# Patient Record
Sex: Female | Born: 1965 | Race: Black or African American | Hispanic: No | Marital: Married | Smoking: Never smoker
Health system: Southern US, Community
[De-identification: ages and names within clinical notes are randomized; demographics above are authoritative.]

## PROBLEM LIST (undated history)

## (undated) DIAGNOSIS — K219 Gastro-esophageal reflux disease without esophagitis: Secondary | ICD-10-CM

## (undated) DIAGNOSIS — K581 Irritable bowel syndrome with constipation: Secondary | ICD-10-CM

## (undated) DIAGNOSIS — I10 Essential (primary) hypertension: Secondary | ICD-10-CM

## (undated) DIAGNOSIS — IMO0002 Reserved for concepts with insufficient information to code with codable children: Secondary | ICD-10-CM

## (undated) DIAGNOSIS — K811 Chronic cholecystitis: Principal | ICD-10-CM

## (undated) DIAGNOSIS — M1990 Unspecified osteoarthritis, unspecified site: Secondary | ICD-10-CM

## (undated) DIAGNOSIS — G9050 Complex regional pain syndrome I, unspecified: Secondary | ICD-10-CM

## (undated) DIAGNOSIS — R011 Cardiac murmur, unspecified: Secondary | ICD-10-CM

## (undated) HISTORY — PX: FOOT SURGERY: SHX648

## (undated) HISTORY — DX: Chronic cholecystitis: K81.1

## (undated) HISTORY — DX: Cardiac murmur, unspecified: R01.1

## (undated) HISTORY — PX: ABDOMINAL HYSTERECTOMY: SHX81

## (undated) HISTORY — DX: Gastro-esophageal reflux disease without esophagitis: K21.9

## (undated) HISTORY — DX: Irritable bowel syndrome with constipation: K58.1

## (undated) HISTORY — DX: Reserved for concepts with insufficient information to code with codable children: IMO0002

## (undated) HISTORY — PX: TONSILLECTOMY: SUR1361

---

## 2001-01-12 ENCOUNTER — Ambulatory Visit (HOSPITAL_COMMUNITY): Admission: RE | Admit: 2001-01-12 | Discharge: 2001-01-12 | Payer: Self-pay | Admitting: Gastroenterology

## 2001-01-12 ENCOUNTER — Encounter: Payer: Self-pay | Admitting: Gastroenterology

## 2001-02-07 ENCOUNTER — Encounter: Payer: Self-pay | Admitting: Gastroenterology

## 2001-02-07 ENCOUNTER — Ambulatory Visit (HOSPITAL_COMMUNITY): Admission: RE | Admit: 2001-02-07 | Discharge: 2001-02-07 | Payer: Self-pay | Admitting: Gastroenterology

## 2001-02-23 ENCOUNTER — Encounter: Admission: RE | Admit: 2001-02-23 | Discharge: 2001-02-23 | Payer: Self-pay | Admitting: Gastroenterology

## 2001-02-23 ENCOUNTER — Encounter: Payer: Self-pay | Admitting: Gastroenterology

## 2001-08-24 ENCOUNTER — Emergency Department (HOSPITAL_COMMUNITY): Admission: EM | Admit: 2001-08-24 | Discharge: 2001-08-24 | Payer: Self-pay | Admitting: Emergency Medicine

## 2001-11-21 ENCOUNTER — Ambulatory Visit (HOSPITAL_COMMUNITY): Admission: RE | Admit: 2001-11-21 | Discharge: 2001-11-21 | Payer: Self-pay | Admitting: Gastroenterology

## 2001-11-21 ENCOUNTER — Encounter: Payer: Self-pay | Admitting: Gastroenterology

## 2004-03-09 ENCOUNTER — Other Ambulatory Visit: Admission: RE | Admit: 2004-03-09 | Discharge: 2004-03-09 | Payer: Self-pay | Admitting: Family Medicine

## 2004-06-01 ENCOUNTER — Inpatient Hospital Stay (HOSPITAL_COMMUNITY): Admission: RE | Admit: 2004-06-01 | Discharge: 2004-06-04 | Payer: Self-pay | Admitting: Obstetrics and Gynecology

## 2005-07-27 ENCOUNTER — Other Ambulatory Visit: Admission: RE | Admit: 2005-07-27 | Discharge: 2005-07-27 | Payer: Self-pay | Admitting: Family Medicine

## 2010-11-03 ENCOUNTER — Other Ambulatory Visit (HOSPITAL_COMMUNITY): Payer: Self-pay | Admitting: Gastroenterology

## 2010-11-05 ENCOUNTER — Ambulatory Visit (HOSPITAL_COMMUNITY)
Admission: RE | Admit: 2010-11-05 | Discharge: 2010-11-05 | Disposition: A | Discharge status: Home / Self Care | Payer: Managed Care, Other (non HMO) | Source: Ambulatory Visit | Attending: Gastroenterology | Admitting: Gastroenterology

## 2010-11-05 DIAGNOSIS — R1011 Right upper quadrant pain: Secondary | ICD-10-CM | POA: Insufficient documentation

## 2010-11-18 ENCOUNTER — Encounter (HOSPITAL_COMMUNITY)
Admission: RE | Admit: 2010-11-18 | Discharge: 2010-11-18 | Disposition: A | Discharge status: Home / Self Care | Payer: Managed Care, Other (non HMO) | Source: Ambulatory Visit | Attending: Gastroenterology | Admitting: Gastroenterology

## 2010-11-18 DIAGNOSIS — R112 Nausea with vomiting, unspecified: Secondary | ICD-10-CM | POA: Insufficient documentation

## 2010-11-18 DIAGNOSIS — R109 Unspecified abdominal pain: Secondary | ICD-10-CM | POA: Insufficient documentation

## 2010-11-18 MED ORDER — TECHNETIUM TC 99M MEBROFENIN IV KIT
5.5000 | PACK | Freq: Once | INTRAVENOUS | Status: AC | PRN
  Administered 2010-11-18: 5.5 via INTRAVENOUS

## 2010-12-20 ENCOUNTER — Other Ambulatory Visit: Payer: Self-pay | Admitting: Gastroenterology

## 2010-12-20 DIAGNOSIS — R112 Nausea with vomiting, unspecified: Secondary | ICD-10-CM

## 2010-12-20 DIAGNOSIS — R1011 Right upper quadrant pain: Secondary | ICD-10-CM

## 2010-12-20 DIAGNOSIS — R1013 Epigastric pain: Secondary | ICD-10-CM

## 2010-12-23 ENCOUNTER — Ambulatory Visit
Admission: RE | Admit: 2010-12-23 | Discharge: 2010-12-23 | Disposition: A | Discharge status: Home / Self Care | Payer: Managed Care, Other (non HMO) | Source: Ambulatory Visit | Attending: Gastroenterology | Admitting: Gastroenterology

## 2010-12-23 DIAGNOSIS — R1011 Right upper quadrant pain: Secondary | ICD-10-CM

## 2010-12-23 DIAGNOSIS — R1013 Epigastric pain: Secondary | ICD-10-CM

## 2010-12-23 DIAGNOSIS — R112 Nausea with vomiting, unspecified: Secondary | ICD-10-CM

## 2010-12-23 MED ORDER — IOHEXOL 300 MG/ML  SOLN
125.0000 mL | Freq: Once | INTRAMUSCULAR | Status: AC | PRN
  Administered 2010-12-23: 125 mL via INTRAVENOUS

## 2011-01-03 ENCOUNTER — Encounter (INDEPENDENT_AMBULATORY_CARE_PROVIDER_SITE_OTHER): Payer: Self-pay | Admitting: Surgery

## 2011-01-03 ENCOUNTER — Ambulatory Visit (INDEPENDENT_AMBULATORY_CARE_PROVIDER_SITE_OTHER): Payer: Managed Care, Other (non HMO) | Admitting: Surgery

## 2011-01-03 VITALS — BP 118/86 | HR 92 | Temp 98.8°F | Resp 20 | Ht 64.0 in | Wt 207.2 lb

## 2011-01-03 DIAGNOSIS — E669 Obesity, unspecified: Secondary | ICD-10-CM

## 2011-01-03 DIAGNOSIS — K811 Chronic cholecystitis: Secondary | ICD-10-CM

## 2011-01-03 DIAGNOSIS — R079 Chest pain, unspecified: Secondary | ICD-10-CM

## 2011-01-03 DIAGNOSIS — R0781 Pleurodynia: Secondary | ICD-10-CM | POA: Insufficient documentation

## 2011-01-03 DIAGNOSIS — K219 Gastro-esophageal reflux disease without esophagitis: Secondary | ICD-10-CM

## 2011-01-03 DIAGNOSIS — R0789 Other chest pain: Secondary | ICD-10-CM | POA: Insufficient documentation

## 2011-01-03 HISTORY — DX: Chronic cholecystitis: K81.1

## 2011-01-03 NOTE — Patient Instructions (Signed)
Gallbladder Disease Gallbladder disease (cholecystitis) is an inflammation of your gallbladder. It is usually caused by a build-up of stones (gallstones) or sludge (cholelithiasis) in your gallbladder. The gallbladder is not an essential organ. It is located slightly to the right of center in the belly (abdomen), behind the liver. It stores bile made in the liver. Bile aids in digestion of fats. Gallbladder disease may result in nausea (feeling sick to your stomach), abdominal pain, and jaundice. In severe cases, emergency surgery may be required. The most common type of gallbladder disease is gallstones. They begin as small crystals and slowly grow into stones. Gallstone pain occurs when the bile duct has spasms. The spasms are caused by the stone passing out of the duct. The stone is trying to pass at the same time bile is passing into the small bowel for digestion. The pain usually begins suddenly. It may persist from several minutes to several hours. Infection can occur. Infection can add to discomfort and severity of an acute attack. The pain may be made worse by breathing deeply or by being jarred. There may be fever and tenderness to the touch. In some cases, when gallstones do not move into the bile duct, people have no pain or symptoms. These are called "silent" gallstones. Women are three times more likely to develop gallstones than men. Women who have had several pregnancies are more likely to have gallbladder disease. Physicians sometimes advise removing diseased gallbladders before future pregnancies. Other factors that increase the risk of gallbladder disease are obesity, diets heavy in fried foods and dairy products, increasing age, prolonged use of medications containing female hormones, and heredity. HOME CARE INSTRUCTIONS   If your physician prescribed an antibiotic, take as directed.   Only take over-the-counter or prescription medicines for pain, discomfort, or fever as directed by your  caregiver.   Follow a low fat diet until seen again. (Fat causes the gallbladder to contract.)   Follow-up as instructed. Attacks are almost always recurrent and surgery is usually required for permanent treatment.  SEEK IMMEDIATE MEDICAL CARE IF:   Pain is increasing and not controlled by medications.   The pain moves to another part of your abdomen or to your back. (Right sided pain can be appendicitis and left sided pain in adults can be diverticulitis).   You have a fever.   You develop nausea and vomiting.  Document Released: 01/03/2005 Document Revised: 09/15/2010 Document Reviewed: 08/23/2007 [facility] Patient Information 2012 [place], [place].  Managing Pain  Pain after surgery or related to activity is often due to strain/injury to muscle, tendon, nerves and/or incisions.  This pain is usually short-term and will improve in a few months.   Many people find it helpful to do the following things TOGETHER to help speed the process of healing and to get back to regular activity more quickly:  1. Avoid heavy physical activity a.  no lifting greater than 20 pounds b. Do not "push through" the pain.  Listen to your body and avoid positions and maneuvers than reproduce the pain c. Walking is okay as tolerated, but go slowly and stop when getting sore.  d. Remember: If it hurts to do it, then don't do it! 2. Take Anti-inflammatory medication  a. Take with food/snack around the clock for 1-2 weeks i. This helps the muscle and nerve tissues become less irritable and calm down faster b. Choose ONE of the following over-the-counter medications: i. Acetaminophen 500mg  tabs (Tylenol) 2 pills with every meal and just before  bedtime 3. Use a Heating pad or Ice/Cold Pack a. 6-8 times a day b. May use warm bath/hottub  or showers 4. Try Gentle Massage and/or Stretching  a. at the area of pain many times a day b. stop if you feel pain - do not overdo it  Try these steps together to help  you body heal faster and avoid making things get worse.  Doing just one of these things may not be enough.    If you are not getting better after two weeks or are noticing you are getting worse, contact our office for further advice; we may need to re-evaluate you & see what other things we can do to help. \

## 2011-01-03 NOTE — Progress Notes (Signed)
Addended by: Ardeth Sportsman on: 01/03/2011 08:47 PM   Modules accepted: Orders

## 2011-01-03 NOTE — Progress Notes (Signed)
Subjective:     Patient ID: Wendy Everett, female   DOB: [birth year], 45 y.o.   MRN: [id number]  HPI  Wendy Everett  05-Nov-1965 [id number]  Patient Care Team: Sissy Hoff as PCP - General (Family Medicine) Charna Elizabeth, MD as Consulting Physician (Gastroenterology)  This patient is a 45 y.o.female who presents today for surgical evaluation at the request of Dr. Loreta Ave.   Reason for visit: Right sided chest wall/flank/back/abdominal pain. Question of gallbladder etiology  Patient is an obese female who's been struggling with pains for several months. She noted she felt some right-sided pain on her trunk back in August 2012. There is no fall or motor vehicle collision. It was after she had been running or lifting. She wondered if she pulled a muscle. It was worse with activity. Worse with sneezing and cough. No vomiting. She tried a heating pad. She tried some Tylenol. She has reflux disease and cannot tolerate nonsteroidals. That did not seem to help.  She was getting nauseated. No vomiting. She changed her diet around. She saw gastroenterology. She switch to a nondairy, bland, high fiber diet. That helped a little bit. She used to have symptoms was eating. They switched her from Nexium to Dexilant and after an endoscopy revealed some esophagitis and a hiatal hernia. She says her abdomen is better, but she still getting the pain of the right trunk.  She describes as intermittent initially.  It's rather sharp at times. Now it is gone to a constant ache. It is worse with activity. She's had some occasional loose stools. No severe diarrhea. No dysphagia, burping, hematochezia, melena, hematemesis. No sick contacts. No travel history. She had similar pains 10 years ago. It seemed more reflux related and it improved when she got on a PPI. This is a little different in that she has more right-sided pain.  She's had a evaluation by gastroenterology. Dr. Loreta Ave issues were not reflux as best I can gather.  Apparently Dr. Loreta Ave saw her last week. The patient had a CAT scan which revealed some kidney and ovarian cysts. No other abnormalities. She comes to me today. She's not exactly certain what was going to be done. She explaining his extreme frustration for "seeing a lot of doctors and still not figure out what's going on" she an ultrasound was normal. She had a hiatus scan which showed a normal gallbladder ejection fraction. However, it did reproduce symptoms.  Patient Active Problem List  Diagnoses  . Chronic cholecystitis without calculus  . Rib pain, R>L chest wall  . GERD (gastroesophageal reflux disease)  . Obesity (BMI 30-39.9)    Past Medical History  Diagnosis Date  . Heart murmur   . GERD (gastroesophageal reflux disease)   . Cyst     on kidney    Past Surgical History  Procedure Date  . Cesarean section   . Abdominal hysterectomy     partial  . Tonsillectomy   . Foot surgery     left    History   Social History  . Marital Status: Married    Spouse Name: N/A    Number of Children: N/A  . Years of Education: N/A   Occupational History  . Not on file.   Social History Main Topics  . Smoking status: Never Smoker   . Smokeless tobacco: Not on file  . Alcohol Use: No  . Drug Use: No  . Sexually Active:    Other Topics Concern  . Not on file  Social History Narrative  . No narrative on file    Family History  Problem Relation Age of Onset  . Heart disease Mother     Current outpatient prescriptions:clotrimazole-betamethasone (LOTRISONE) cream, BID times 48H., Disp: , Rfl: ;  HYDROcodone-acetaminophen (VICODIN) 5-500 MG per tablet, Ad lib., Disp: , Rfl:   No Known Allergies  BP 118/86  Pulse 92  Temp(Src) 98.8 F (37.1 C) (Temporal)  Resp 20  Ht 5\' 4"  (1.626 m)  Wt 207 lb 3.2 oz (93.985 kg)  BMI 35.57 kg/m2     Review of Systems  Constitutional: Negative for fever, chills, diaphoresis, appetite change and fatigue.  HENT: Negative for ear  pain, sore throat, trouble swallowing, neck pain and ear discharge.   Eyes: Negative for photophobia, discharge and visual disturbance.  Respiratory: Negative for cough, choking, chest tightness and shortness of breath.   Cardiovascular: Positive for chest pain. Negative for palpitations.  Gastrointestinal: Positive for nausea, abdominal pain and abdominal distention. Negative for vomiting, diarrhea, constipation, anal bleeding and rectal pain.  Genitourinary: Negative for dysuria, frequency and difficulty urinating.  Musculoskeletal: Positive for back pain. Negative for myalgias, joint swelling, arthralgias and gait problem.  Skin: Negative for color change, pallor and rash.  Neurological: Negative for dizziness, speech difficulty, weakness and numbness.  Hematological: Negative for adenopathy.  Psychiatric/Behavioral: Negative for confusion and agitation. The patient is not nervous/anxious.        Objective:   Physical Exam  Constitutional: She is oriented to person, place, and time. She appears well-developed and well-nourished. No distress.  HENT:  Head: Normocephalic.  Mouth/Throat: Oropharynx is clear and moist. No oropharyngeal exudate.  Eyes: Conjunctivae and EOM are normal. Pupils are equal, round, and reactive to light. No scleral icterus.  Neck: Normal range of motion. Neck supple. No tracheal deviation present.  Cardiovascular: Normal rate, regular rhythm and intact distal pulses.   Pulmonary/Chest: Effort normal and breath sounds normal. No respiratory distress. She exhibits no tenderness.  Abdominal: Soft. She exhibits no distension and no mass. There is no tenderness. Hernia confirmed negative in the right inguinal area and confirmed negative in the left inguinal area.  Genitourinary: No vaginal discharge found.  Musculoskeletal: Normal range of motion. She exhibits no tenderness.  Lymphadenopathy:    She has no cervical adenopathy.       Right: No inguinal adenopathy  present.       Left: No inguinal adenopathy present.  Neurological: She is alert and oriented to person, place, and time. No cranial nerve deficit. She exhibits normal muscle tone. Coordination normal.  Skin: Skin is warm and dry. No rash noted. She is not diaphoretic. No erythema.  Psychiatric: She has a normal mood and affect. Judgment and thought content normal. Her mood appears not anxious. Her affect is not angry. Her speech is tangential. Her speech is not rapid and/or pressured, not delayed and not slurred. She is not aggressive and is not hyperactive. She is communicative.       Assessment:     RUQ / flank / abdominal / back pain of most likely multi-factorial etiology.  I suspect she is accommodation of reflux, musculoskeletal injury, and biliary dyskinesia.    Plan:     I cautioned her that no intervention is going to solve everything. I recommended she try Tylenol 1 g 4 times a day and heat 8 times a day and no lifting greater than 20 pounds. But hopefully can help some of her musculoskeletal symptoms. I did not offer  narcotics at this time.  Continue on some part of proton pump inhibitor twice a day. I will defer to Dr. Loreta Ave on that  Given how she is sore in the right flank and the rest of her workup was completely normal with normal labs and radiology studies, I think she would benefit from cholecystectomy. The hydroscan did reproduce her symptoms.  I am cautious on this. I warned her that there is a chance this does not solve anything. I offered to discuss doing a gastric emptying study. She did have one several years ago that was normal. She did not give me the bloating or constipation story they usually see with that. However, take to be thorough that be something to consider. She's never had a colonoscopy and that may be of use to rule out something else going on. However, it would not explain her musculoskeletal type symptoms. At this point she wants to proceed with surgery in  the hopes of at least help simplify her symptoms. I wonder she's getting nausea vomiting retching coughing that is making her soreness worse. Him hopefully to break his cycle with a cholecystectomy. I did discuss the procedure with her. It is reasonable to try a single site technique although she is obese and he can be more technically demanding that way:  The anatomy & physiology of hepatobiliary & pancreatic function was discussed.  The pathophysiology of gallbladder dysfunction was discussed.  Natural history risks without surgery was discussed.   I feel the risks of no intervention will lead to serious problems that outweigh the operative risks; therefore, I recommended cholecystectomy to remove the pathology.  I explained laparoscopic techniques with possible need for an open approach.  Probable cholangiogram to evaluate the bilary tract was explained as well.    Risks such as bleeding, infection, abscess, leak, injury to other organs, need for further treatment, heart attack, death, and other risks were discussed.  I noted a good likelihood this will help address the problem.  Possibility that this will not correct all abdominal symptoms was explained.  Goals of post-operative recovery were discussed as well.  We will work to minimize complications.  An educational handout further explaining the pathology and treatment options was given as well.  Questions were answered.  The patient expresses understanding & wishes to proceed with surgery.

## 2011-01-27 ENCOUNTER — Encounter (INDEPENDENT_AMBULATORY_CARE_PROVIDER_SITE_OTHER): Payer: Self-pay | Admitting: Gastroenterology

## 2011-02-11 ENCOUNTER — Other Ambulatory Visit (INDEPENDENT_AMBULATORY_CARE_PROVIDER_SITE_OTHER): Payer: Self-pay | Admitting: Surgery

## 2011-02-11 DIAGNOSIS — K811 Chronic cholecystitis: Secondary | ICD-10-CM

## 2011-02-11 DIAGNOSIS — K824 Cholesterolosis of gallbladder: Secondary | ICD-10-CM

## 2011-02-11 HISTORY — PX: LAPAROSCOPIC CHOLECYSTECTOMY: SUR755

## 2011-02-15 ENCOUNTER — Telehealth (INDEPENDENT_AMBULATORY_CARE_PROVIDER_SITE_OTHER): Payer: Self-pay

## 2011-02-15 NOTE — Telephone Encounter (Signed)
Patient unsure about post op pain medication.  She was told to take Aleve or Advil for pain.  Per Elease Hashimoto (Dr. Michaell Cowing nurse)  Dr. Michaell Cowing would recommend each. Patient given the message and advised not to take aspirin base pain medication. Choose one and not to exceed package dosing.

## 2011-03-02 ENCOUNTER — Encounter (INDEPENDENT_AMBULATORY_CARE_PROVIDER_SITE_OTHER): Payer: Self-pay | Admitting: Surgery

## 2011-03-02 ENCOUNTER — Ambulatory Visit (INDEPENDENT_AMBULATORY_CARE_PROVIDER_SITE_OTHER): Payer: Managed Care, Other (non HMO) | Admitting: Surgery

## 2011-03-02 VITALS — BP 118/74 | HR 76 | Resp 16 | Ht 64.0 in | Wt 206.0 lb

## 2011-03-02 DIAGNOSIS — R079 Chest pain, unspecified: Secondary | ICD-10-CM

## 2011-03-02 DIAGNOSIS — K581 Irritable bowel syndrome with constipation: Secondary | ICD-10-CM

## 2011-03-02 DIAGNOSIS — R0781 Pleurodynia: Secondary | ICD-10-CM

## 2011-03-02 DIAGNOSIS — K811 Chronic cholecystitis: Secondary | ICD-10-CM

## 2011-03-02 DIAGNOSIS — K589 Irritable bowel syndrome without diarrhea: Secondary | ICD-10-CM

## 2011-03-02 HISTORY — DX: Irritable bowel syndrome with constipation: K58.1

## 2011-03-02 MED ORDER — HYDROCODONE-ACETAMINOPHEN 5-500 MG PO TABS
1.0000 | ORAL_TABLET | Freq: Three times a day (TID) | ORAL | Status: DC | PRN
Start: 2011-03-02 — End: 2011-03-29

## 2011-03-02 NOTE — Patient Instructions (Addendum)
GETTING TO GOOD BOWEL HEALTH. Irregular bowel habits such as constipation and diarrhea can lead to many problems over time.  Having one soft bowel movement a day is the most important way to prevent further problems.  The anorectal canal is designed to handle stretching and feces to safely manage our ability to get rid of solid waste (feces, poop, stool) out of our body.  BUT, hard constipated stools can act like ripping concrete bricks and diarrhea can be a burning fire to this very sensitive area of our body, causing inflamed hemorrhoids, anal fissures, increasing risk is perirectal abscesses, abdominal pain/bloating, an making irritable bowel worse.     The goal: ONE SOFT BOWEL MOVEMENT A DAY!  To have soft, regular bowel movements:    Drink at least 8 tall glasses of water a day.     Take plenty of fiber.  Fiber is the undigested part of plant food that passes into the colon, acting s "natures broom" to encourage bowel motility and movement.  Fiber can absorb and hold large amounts of water. This results in a larger, bulkier stool, which is soft and easier to pass. Work gradually over several weeks up to 6 servings a day of fiber (25g a day even more if needed) in the form of: o Vegetables -- Root (potatoes, carrots, turnips), leafy green (lettuce, salad greens, celery, spinach), or cooked high residue (cabbage, broccoli, etc) o Fruit -- Fresh (unpeeled skin & pulp), Dried (prunes, apricots, cherries, etc ),  or stewed ( applesauce)  o Whole grain breads, pasta, etc (whole wheat)  o Bran cereals    Bulking Agents -- This type of water-retaining fiber generally is easily obtained each day by one of the following:  o Psyllium bran -- The psyllium plant is remarkable because its ground seeds can retain so much water. This product is available as Metamucil, Konsyl, Effersyllium, Per Diem Fiber, or the less expensive generic preparation in drug and health food stores. Although labeled a laxative, it really  is not a laxative.  o Methylcellulose -- This is another fiber derived from wood which also retains water. It is available as Citrucel. o Polyethylene Glycol - and "artificial" fiber commonly called Miralax or Glycolax.  It is helpful for people with gassy or bloated feelings with regular fiber o Flax Seed - a less gassy fiber than psyllium   No reading or other relaxing activity while on the toilet. If bowel movements take longer than 5 minutes, you are too constipated   AVOID CONSTIPATION.  High fiber and water intake usually takes care of this.  Sometimes a laxative is needed to stimulate more frequent bowel movements, but    Laxatives are not a good long-term solution as it can wear the colon out. o Osmotics (Milk of Magnesia, Fleets phosphosoda, Magnesium citrate, MiraLax, GoLytely) are safer than  o Stimulants (Senokot, Castor Oil, Dulcolax, Ex Lax)    o Do not take laxatives for more than 7days in a row.    IF SEVERELY CONSTIPATED, try a Bowel Retraining Program: o Do not use laxatives.  o Eat a diet high in roughage, such as bran cereals and leafy vegetables.  o Drink six (6) ounces of prune or apricot juice each morning.  o Eat two (2) large servings of stewed fruit each day.  o Take one (1) heaping tablespoon of a psyllium-based bulking agent twice a day. Use sugar-free sweetener when possible to avoid excessive calories.  o Eat a normal breakfast.  o   Set aside 15 minutes after breakfast to sit on the toilet, but do not strain to have a bowel movement.  o If you do not have a bowel movement by the third day, use an enema and repeat the above steps.    Controlling diarrhea o Switch to liquids and simpler foods for a few days to avoid stressing your intestines further. o Avoid dairy products (especially milk & ice cream) for a short time.  The intestines often can lose the ability to digest lactose when stressed. o Avoid foods that cause gassiness or bloating.  Typical foods include  beans and other legumes, cabbage, broccoli, and dairy foods.  Every person has some sensitivity to other foods, so listen to our body and avoid those foods that trigger problems for you. o Adding fiber (Citrucel, Metamucil, psyllium, Miralax) gradually can help thicken stools by absorbing excess fluid and retrain the intestines to act more normally.  Slowly increase the dose over a few weeks.  Too much fiber too soon can backfire and cause cramping & bloating. o Probiotics (such as active yogurt, Align, etc) may help repopulate the intestines and colon with normal bacteria and calm down a sensitive digestive tract.  Most studies show it to be of mild help, though, and such products can be costly. o Medicines:   Bismuth subsalicylate (ex. Kayopectate, Pepto Bismol) every 30 minutes for up to 6 doses can help control diarrhea.  Avoid if pregnant.   Loperamide (Immodium) can slow down diarrhea.  Start with two tablets (4mg total) first and then try one tablet every 6 hours.  Avoid if you are having fevers or severe pain.  If you are not better or start feeling worse, stop all medicines and call your doctor for advice o Call your doctor if you are getting worse or not better.  Sometimes further testing (cultures, endoscopy, X-ray studies, bloodwork, etc) may be needed to help diagnose and treat the cause of the diarrhea.   Managing Pain  Pain after surgery or related to activity is often due to strain/injury to muscle, tendon, nerves and/or incisions.  This pain is usually short-term and will improve in a few months.   Many people find it helpful to do the following things TOGETHER to help speed the process of healing and to get back to regular activity more quickly:  1. Avoid heavy physical activity a.  no lifting greater than 20 pounds b. Do not "push through" the pain.  Listen to your body and avoid positions and maneuvers than reproduce the pain c. Walking is okay as tolerated, but go slowly and  stop when getting sore.  d. Remember: If it hurts to do it, then don't do it! 2. Take Anti-inflammatory medication  a. Take with food/snack around the clock for 1-2 weeks i. This helps the muscle and nerve tissues become less irritable and calm down faster b. Choose ONE of the following over-the-counter medications: i. Naproxen 220mg tabs (ex. Aleve) 1-2 pills twice a day  ii. Ibuprofen 200mg tabs (ex. Advil, Motrin) 3-4 pills with every meal and just before bedtime iii. Acetaminophen 500mg tabs (Tylenol) 1-2 pills with every meal and just before bedtime 3. Use a Heating pad or Ice/Cold Pack a. 4-6 times a day b. May use warm bath/hottub  or showers 4. Try Gentle Massage and/or Stretching  a. at the area of pain many times a day b. stop if you feel pain - do not overdo it  Try these steps together to   help you body heal faster and avoid making things get worse.  Doing just one of these things may not be enough.    If you are not getting better after two weeks or are noticing you are getting worse, contact our office for further advice; we may need to re-evaluate you & see what other things we can do to help.   

## 2011-03-02 NOTE — Progress Notes (Signed)
Subjective:     Patient ID: Wendy Everett, female   DOB: 1965-07-12, 46 y.o.   MRN: [id number]  HPI  Wendy Everett  1965-02-13 [id number]  Patient Care Team: Sissy Hoff, MD as PCP - General (Family Medicine) Charna Elizabeth, MD as Consulting Physician (Gastroenterology)  This patient is a 46 y.o.female who presents today for surgical evaluation.   Procedure: Single site laparoscopic cholecystectomy  Pathology: Chronic cholecystitis and cholesterolosis 02/11/2011  The patient comes in today definitely feeling better. She is still struggling with numerous things. She still gets nausea rather regularly. However it has not progressed to vomiting like it was doing constantly. Less intense.  Somne early satiety but a little better.  Her pain overall is less. Much reduced in the right upper side. Some occasional left upper quadrant pressure/chest wall pain. No longer on the right chest wall. Periumbilical pain as well. However that his "easing up." Had an episode of pulling last weekend. Ran out of narcotics. Has been riding it out knowing that she was seeing me in a few days later, today. Using Aleve.  Having bowel movements every 2-3 days. Using prune juice and a stool softener. Not really taking fiber. Trying some yogurt per Dr. Kenna Gilbert recommendations in the past.  Despite her issues, she definitely feels like she's better than she was before surgery and feels like she is gradually getting better.  Patient Active Problem List  Diagnoses  . Chronic cholecystitis without calculus  . Rib pain, L chest wall  . GERD (gastroesophageal reflux disease)  . Obesity (BMI 30-39.9)  . Irritable bowel syndrome with constipation    Past Medical History  Diagnosis Date  . Heart murmur   . GERD (gastroesophageal reflux disease)   . Cyst     on kidney  . Chronic cholecystitis without calculus 01/03/2011  . Irritable bowel syndrome with constipation 03/02/2011    Past Surgical History  Procedure Date    . Cesarean section   . Abdominal hysterectomy     partial  . Tonsillectomy   . Foot surgery     left  . Laparoscopic cholecystectomy 02/11/11    single site    History   Social History  . Marital Status: Married    Spouse Name: N/A    Number of Children: N/A  . Years of Education: N/A   Occupational History  . Not on file.   Social History Main Topics  . Smoking status: Never Smoker   . Smokeless tobacco: Not on file  . Alcohol Use: No  . Drug Use: No  . Sexually Active:    Other Topics Concern  . Not on file   Social History Narrative  . No narrative on file    Family History  Problem Relation Age of Onset  . Heart disease Mother     Current outpatient prescriptions:clotrimazole-betamethasone (LOTRISONE) cream, BID times 48H., Disp: , Rfl: ;  HYDROcodone-acetaminophen (VICODIN) 5-500 MG per tablet, Take 1-2 tablets by mouth every 8 (eight) hours as needed for pain., Disp: 40 tablet, Rfl: 0  No Known Allergies  BP 118/74  Pulse 76  Resp 16  Ht 5\' 4"  (1.626 m)  Wt 206 lb (93.441 kg)  BMI 35.36 kg/m2     Review of Systems  Constitutional: Negative for fever, chills and diaphoresis.  HENT: Negative for ear pain, sore throat and trouble swallowing.   Eyes: Negative for photophobia and visual disturbance.  Respiratory: Negative for cough and choking.   Cardiovascular: Positive for chest  pain. Negative for palpitations.  Gastrointestinal: Positive for nausea and constipation. Negative for vomiting, abdominal pain, diarrhea, anal bleeding and rectal pain.  Genitourinary: Negative for dysuria, frequency and difficulty urinating.  Musculoskeletal: Negative for myalgias, back pain, joint swelling, arthralgias and gait problem.  Skin: Negative for color change, pallor and rash.  Neurological: Negative for dizziness, speech difficulty, weakness and numbness.  Hematological: Negative for adenopathy.  Psychiatric/Behavioral: Negative for confusion and agitation.  The patient is not nervous/anxious.        Objective:   Physical Exam  Constitutional: She is oriented to person, place, and time. She appears well-developed and well-nourished. No distress.  HENT:  Head: Normocephalic.  Mouth/Throat: Oropharynx is clear and moist. No oropharyngeal exudate.  Eyes: Conjunctivae and EOM are normal. Pupils are equal, round, and reactive to light. No scleral icterus.  Neck: Normal range of motion. No tracheal deviation present.  Cardiovascular: Normal rate and intact distal pulses.   Pulmonary/Chest: Effort normal. No respiratory distress. She exhibits tenderness.       Mild left lat chest wall TTP (both on R as well - that is gone)  Abdominal: Soft. She exhibits no distension and no mass. There is no rebound and no guarding. Hernia confirmed negative in the right inguinal area and confirmed negative in the left inguinal area.       Dressing still on - I removed.  Incision clean with normal healing ridges.  Mild TTP.  No hernias  Genitourinary: No vaginal discharge found.  Musculoskeletal: Normal range of motion. She exhibits no tenderness.  Lymphadenopathy:       Right: No inguinal adenopathy present.       Left: No inguinal adenopathy present.  Neurological: She is alert and oriented to person, place, and time. No cranial nerve deficit. She exhibits normal muscle tone. Coordination normal.  Skin: Skin is warm and dry. No rash noted. She is not diaphoretic.  Psychiatric: She has a normal mood and affect. Her behavior is normal.       Assessment:     Gradually improving but a ways to go    Plan:     Considering where she was, I do think she is better. Her attitude is actually quite great. She is optimistic to continue to improve. I do want to follow her her little further along to make sure she makes improvements. I suspect that her bowel is related to this. I will defer to Dr. Loreta Ave on her workup and if she feels that diagnosis is accurate as well. I  think is going to take a few months to see what her baseline will be. However, I am hopeful that she is gradually making improvements from where she was in a pretty miserable place  Increase activity as tolerated.  Do not push through pain.  Advanced on diet as tolerated. Bowel regimen to avoid problems.  Start w fiber supp every day & continue yogurt/probiotic.    Return to clinic 2-3 weeks to make sure she is improving. The patient expressed understanding and appreciation

## 2011-03-24 ENCOUNTER — Encounter (INDEPENDENT_AMBULATORY_CARE_PROVIDER_SITE_OTHER): Payer: Self-pay | Admitting: Surgery

## 2011-03-29 ENCOUNTER — Encounter (INDEPENDENT_AMBULATORY_CARE_PROVIDER_SITE_OTHER): Payer: Self-pay | Admitting: Surgery

## 2011-03-29 ENCOUNTER — Ambulatory Visit (INDEPENDENT_AMBULATORY_CARE_PROVIDER_SITE_OTHER): Payer: Managed Care, Other (non HMO) | Admitting: Surgery

## 2011-03-29 VITALS — BP 138/96 | HR 72 | Temp 98.6°F | Resp 16 | Ht 64.0 in | Wt 206.0 lb

## 2011-03-29 DIAGNOSIS — K811 Chronic cholecystitis: Secondary | ICD-10-CM

## 2011-03-29 DIAGNOSIS — K589 Irritable bowel syndrome without diarrhea: Secondary | ICD-10-CM

## 2011-03-29 DIAGNOSIS — K219 Gastro-esophageal reflux disease without esophagitis: Secondary | ICD-10-CM

## 2011-03-29 DIAGNOSIS — K581 Irritable bowel syndrome with constipation: Secondary | ICD-10-CM

## 2011-03-29 MED ORDER — PROMETHAZINE HCL 12.5 MG PO TABS: 12.5000 mg | ORAL_TABLET | Freq: Four times a day (QID) | ORAL | Status: AC | PRN

## 2011-03-29 NOTE — Patient Instructions (Addendum)
GETTING TO GOOD BOWEL HEALTH. Irregular bowel habits such as constipation and diarrhea can lead to many problems over time.  Having one soft bowel movement a day is the most important way to prevent further problems.  The anorectal canal is designed to handle stretching and feces to safely manage our ability to get rid of solid waste (feces, poop, stool) out of our body.  BUT, hard constipated stools can act like ripping concrete bricks and diarrhea can be a burning fire to this very sensitive area of our body, causing inflamed hemorrhoids, anal fissures, increasing risk is perirectal abscesses, abdominal pain/bloating, an making irritable bowel worse.     The goal: ONE SOFT BOWEL MOVEMENT A DAY!  To have soft, regular bowel movements:    Drink at least 8 tall glasses of water a day.     Take plenty of fiber.  Fiber is the undigested part of plant food that passes into the colon, acting s "natures broom" to encourage bowel motility and movement.  Fiber can absorb and hold large amounts of water. This results in a larger, bulkier stool, which is soft and easier to pass. Work gradually over several weeks up to 6 servings a day of fiber (25g a day even more if needed) in the form of: o Vegetables -- Root (potatoes, carrots, turnips), leafy green (lettuce, salad greens, celery, spinach), or cooked high residue (cabbage, broccoli, etc) o Fruit -- Fresh (unpeeled skin & pulp), Dried (prunes, apricots, cherries, etc ),  or stewed ( applesauce)  o Whole grain breads, pasta, etc (whole wheat)  o Bran cereals    Bulking Agents -- This type of water-retaining fiber generally is easily obtained each day by one of the following:  o Psyllium bran -- The psyllium plant is remarkable because its ground seeds can retain so much water. This product is available as Metamucil, Konsyl, Effersyllium, Per Diem Fiber, or the less expensive generic preparation in drug and health food stores. Although labeled a laxative, it really  is not a laxative.  o Methylcellulose -- This is another fiber derived from wood which also retains water. It is available as Citrucel. o Polyethylene Glycol - and "artificial" fiber commonly called Miralax or Glycolax.  It is helpful for people with gassy or bloated feelings with regular fiber o Flax Seed - a less gassy fiber than psyllium   No reading or other relaxing activity while on the toilet. If bowel movements take longer than 5 minutes, you are too constipated   AVOID CONSTIPATION.  High fiber and water intake usually takes care of this.  Sometimes a laxative is needed to stimulate more frequent bowel movements, but    Laxatives are not a good long-term solution as it can wear the colon out. o Osmotics (Milk of Magnesia, Fleets phosphosoda, Magnesium citrate, MiraLax, GoLytely) are safer than  o Stimulants (Senokot, Castor Oil, Dulcolax, Ex Lax)    o Do not take laxatives for more than 7days in a row.    IF SEVERELY CONSTIPATED, try a Bowel Retraining Program: o Do not use laxatives.  o Eat a diet high in roughage, such as bran cereals and leafy vegetables.  o Drink six (6) ounces of prune or apricot juice each morning.  o Eat two (2) large servings of stewed fruit each day.  o Take one (1) heaping tablespoon of a psyllium-based bulking agent twice a day. Use sugar-free sweetener when possible to avoid excessive calories.  o Eat a normal breakfast.  o   Set aside 15 minutes after breakfast to sit on the toilet, but do not strain to have a bowel movement.  o If you do not have a bowel movement by the third day, use an enema and repeat the above steps.    Controlling diarrhea o Switch to liquids and simpler foods for a few days to avoid stressing your intestines further. o Avoid dairy products (especially milk & ice cream) for a short time.  The intestines often can lose the ability to digest lactose when stressed. o Avoid foods that cause gassiness or bloating.  Typical foods include  beans and other legumes, cabbage, broccoli, and dairy foods.  Every person has some sensitivity to other foods, so listen to our body and avoid those foods that trigger problems for you. o Adding fiber (Citrucel, Metamucil, psyllium, Miralax) gradually can help thicken stools by absorbing excess fluid and retrain the intestines to act more normally.  Slowly increase the dose over a few weeks.  Too much fiber too soon can backfire and cause cramping & bloating. o Probiotics (such as active yogurt, Align, etc) may help repopulate the intestines and colon with normal bacteria and calm down a sensitive digestive tract.  Most studies show it to be of mild help, though, and such products can be costly. o Medicines:   Bismuth subsalicylate (ex. Kayopectate, Pepto Bismol) every 30 minutes for up to 6 doses can help control diarrhea.  Avoid if pregnant.   Loperamide (Immodium) can slow down diarrhea.  Start with two tablets (4mg  total) first and then try one tablet every 6 hours.  Avoid if you are having fevers or severe pain.  If you are not better or start feeling worse, stop all medicines and call your doctor for advice o Call your doctor if you are getting worse or not better.  Sometimes further testing (cultures, endoscopy, X-ray studies, bloodwork, etc) may be needed to help diagnose and treat the cause of the diarrhea.   Managing Pain  Pain after surgery or related to activity is often due to strain/injury to muscle, tendon, nerves and/or incisions.  This pain is usually short-term and will improve in a few months.   Many people find it helpful to do the following things TOGETHER to help speed the process of healing and to get back to regular activity more quickly:  1. Avoid heavy physical activity a.  no lifting greater than 20 pounds b. Do not "push through" the pain.  Listen to your body and avoid positions and maneuvers than reproduce the pain c. Walking is okay as tolerated, but go slowly and  stop when getting sore.  d. Remember: If it hurts to do it, then don't do it! 2. Take Anti-inflammatory medication  a. Take with food/snack around the clock for 1-2 weeks i. This helps the muscle and nerve tissues become less irritable and calm down faster b. Choose ONE of the following over-the-counter medications: i. Acetaminophen 500mg  tabs (Tylenol) 1-2 pills with every meal and just before bedtime 3. Use a Heating pad or Ice/Cold Pack a. 4-6 times a day b. May use warm bath/hottub  or showers 4. Try Gentle Massage and/or Stretching  a. at the area of pain many times a day b. stop if you feel pain - do not overdo it  Try these steps together to help you body heal faster and avoid making things get worse.  Doing just one of these things may not be enough.    If you are  not getting better after two weeks or are noticing you are getting worse, contact our office for further advice; we may need to re-evaluate you & see what other things we can do to help.

## 2011-03-29 NOTE — Progress Notes (Signed)
Subjective:     Patient ID: Wendy Everett, female   DOB: [birth year], 46 y.o.   MRN: [id number]  HPI   Wendy Everett  1965-08-23 [id number]  Patient Care Team: Sissy Hoff, MD as PCP - General (Family Medicine) Charna Elizabeth, MD as Consulting Physician (Gastroenterology)  This patient is a 46 y.o.female who presents today for surgical evaluation.   Procedure: Single site laparoscopic cholecystectomy  Pathology: Chronic cholecystitis and cholesterolosis 02/11/2011  The patient comes in today feeling better, but still struggling with numerous things. She still gets nausea, not progressing to vomiting like it was doing constantly. Less intense.  Not taking any meds for this.  Some early satiety but a little better.  Her pain overall is less.   She still gets pain in right upper side radiating to her knee with activity.  No N/V with it.  Using Aleve.  Still with occasional reflux.  Dexilant & smaller meals help.  Trying to eat baked foods only.  No fast food/grease.  Having bowel movements daily now. Using fiber.   Despite her issues, she feels like she's better than she was before surgery and feels like she is gradually getting better, just not as fast as she hoped  Patient Active Problem List  Diagnoses  . Chronic cholecystitis without calculus  . Rib pain, L chest wall  . GERD (gastroesophageal reflux disease)  . Obesity (BMI 30-39.9)  . Irritable bowel syndrome with constipation    Past Medical History  Diagnosis Date  . Heart murmur   . GERD (gastroesophageal reflux disease)   . Cyst     on kidney  . Chronic cholecystitis without calculus 01/03/2011  . Irritable bowel syndrome with constipation 03/02/2011    Past Surgical History  Procedure Date  . Cesarean section   . Abdominal hysterectomy     partial  . Tonsillectomy   . Foot surgery     left  . Laparoscopic cholecystectomy 02/11/11    single site    History   Social History  . Marital Status: Married   Spouse Name: N/A    Number of Children: N/A  . Years of Education: N/A   Occupational History  . Not on file.   Social History Main Topics  . Smoking status: Never Smoker   . Smokeless tobacco: Not on file  . Alcohol Use: No  . Drug Use: No  . Sexually Active:    Other Topics Concern  . Not on file   Social History Narrative  . No narrative on file    Family History  Problem Relation Age of Onset  . Heart disease Mother     Current outpatient prescriptions:clotrimazole-betamethasone (LOTRISONE) cream, BID times 48H., Disp: , Rfl: ;  DEXILANT 60 MG capsule, Ad lib., Disp: , Rfl: ;  promethazine (PHENERGAN) 12.5 MG tablet, Take 1-2 tablets (12.5-25 mg total) by mouth every 6 (six) hours as needed for nausea., Disp: 20 tablet, Rfl: 3  No Known Allergies  BP 138/96  Pulse 72  Temp(Src) 98.6 F (37 C) (Temporal)  Resp 16  Ht 5\' 4"  (1.626 m)  Wt 206 lb (93.441 kg)  BMI 35.36 kg/m2     Review of Systems  Constitutional: Negative for fever, chills and diaphoresis.  HENT: Negative for ear pain, sore throat and trouble swallowing.   Eyes: Negative for photophobia and visual disturbance.  Respiratory: Negative for cough and choking.   Cardiovascular: Positive for chest pain. Negative for palpitations.  Gastrointestinal: Positive for nausea and  constipation. Negative for vomiting, abdominal pain, diarrhea, anal bleeding and rectal pain.  Genitourinary: Negative for dysuria, frequency and difficulty urinating.  Musculoskeletal: Negative for myalgias, back pain, joint swelling, arthralgias and gait problem.  Skin: Negative for color change, pallor and rash.  Neurological: Negative for dizziness, speech difficulty, weakness and numbness.  Hematological: Negative for adenopathy.  Psychiatric/Behavioral: Negative for confusion and agitation. The patient is not nervous/anxious.        Objective:   Physical Exam  Constitutional: She is oriented to person, place, and time. She  appears well-developed and well-nourished. No distress.  HENT:  Head: Normocephalic.  Mouth/Throat: Oropharynx is clear and moist. No oropharyngeal exudate.  Eyes: Conjunctivae and EOM are normal. Pupils are equal, round, and reactive to light. No scleral icterus.  Neck: Normal range of motion. No tracheal deviation present.  Cardiovascular: Normal rate and intact distal pulses.   Pulmonary/Chest: Effort normal. No respiratory distress. She exhibits no tenderness.  Abdominal: Soft. She exhibits no distension and no mass. There is no rebound and no guarding. Hernia confirmed negative in the right inguinal area and confirmed negative in the left inguinal area.       Incision clean with normal healing ridges.  Mild TTP.  No hernias  Genitourinary: No vaginal discharge found.  Musculoskeletal: Normal range of motion. She exhibits no tenderness.  Lymphadenopathy:       Right: No inguinal adenopathy present.       Left: No inguinal adenopathy present.  Neurological: She is alert and oriented to person, place, and time. No cranial nerve deficit. She exhibits normal muscle tone. Coordination normal.  Skin: Skin is warm and dry. No rash noted. She is not diaphoretic.  Psychiatric: She has a normal mood and affect. Her behavior is normal.       Assessment:     Gradually improving with INS, GERD, chronic constipation.  I think she has a MS component to her pain   Plan:     Considering where she was, I do think she is better, gradually making improvements from where she was in a pretty miserable place  Increase activity as tolerated.  Do not push through pain.  Tylenol & heat RTClock to help with MS pain  Stop Aleve as it is not good to continue with h/o  GERD.  Add H2B QHS to help (ZAntac OTC 2 QHS)  Phenergan PRN nausea  Advanced on diet as tolerated. Bowel regimen to avoid problems.  Continue w fiber supp every day & continue yogurt/probiotic.    RTC PRN. Consider seeing GI again if not  better by May 2013.  I noted it will take months for her many issues to settle into a new equilibrium.  The patient expressed understanding and appreciation  RTW.  Full activity OK

## 2011-12-26 ENCOUNTER — Encounter (HOSPITAL_COMMUNITY): Payer: Self-pay | Admitting: Emergency Medicine

## 2011-12-26 ENCOUNTER — Emergency Department (HOSPITAL_COMMUNITY)
Admission: EM | Admit: 2011-12-26 | Discharge: 2011-12-26 | Discharge status: Against Medical Advice | Payer: Self-pay | Attending: Emergency Medicine | Admitting: Emergency Medicine

## 2011-12-26 ENCOUNTER — Emergency Department (HOSPITAL_COMMUNITY): Payer: Self-pay

## 2011-12-26 DIAGNOSIS — R079 Chest pain, unspecified: Secondary | ICD-10-CM | POA: Insufficient documentation

## 2011-12-26 NOTE — ED Notes (Addendum)
PT. REPORTS CHEST PAIN ONSET THIS EVENING WITH HEADACHE , STATES " CAN'T SLEEP FOR 2 DAYS" - BROTHER CRITICALLY ILL IN THE HOSPITAL , NEAR SYNCOPAL EPISODE THIS EVENING WHILE SPEAKING WITH BROTHER'S MD , DENIES SOB OR NAUSEA.

## 2011-12-26 NOTE — ED Notes (Signed)
No answer x1

## 2011-12-26 NOTE — ED Notes (Addendum)
PT. REFUSED EKG / BLOOD SPECIMEN DRAWN AT TRIAGE , NURSE EXPLAINED IMPORTANCE OF EKG - PT. STRONGLY REFUSED.

## 2015-02-06 ENCOUNTER — Ambulatory Visit: Payer: Worker's Compensation

## 2015-02-06 ENCOUNTER — Ambulatory Visit (INDEPENDENT_AMBULATORY_CARE_PROVIDER_SITE_OTHER): Payer: Worker's Compensation | Admitting: Family Medicine

## 2015-02-06 VITALS — BP 128/84 | HR 86 | Temp 98.3°F | Resp 16 | Ht 64.0 in | Wt 214.0 lb

## 2015-02-06 DIAGNOSIS — M542 Cervicalgia: Secondary | ICD-10-CM

## 2015-02-06 DIAGNOSIS — S161XXA Strain of muscle, fascia and tendon at neck level, initial encounter: Secondary | ICD-10-CM

## 2015-02-06 DIAGNOSIS — R55 Syncope and collapse: Secondary | ICD-10-CM | POA: Diagnosis not present

## 2015-02-06 MED ORDER — CYCLOBENZAPRINE HCL 5 MG PO TABS: ORAL_TABLET | ORAL | Status: DC

## 2015-02-06 NOTE — Progress Notes (Signed)
Subjective:  By signing my name below, I, [name], attest that this documentation has been prepared under the direction and in the presence of [name], MD.  Electronically Signed: Andrew Au, ED Scribe. 02/06/2015. 8:44 AM.   Patient ID: Wendy Everett, female    DOB: 04-21-1965, 50 y.o.   MRN: [id number]  HPI Chief Complaint  Patient presents with  . Headache    x 2 days  . Neck Pain    x 2 days  . Shoulder Pain    right side/ x 2 days  . Panic Attack    pt had a panic attack at work, her BP was elevated causing her to fall./ x 2 days    HPI Comments: Wendy Everett is a 50 y.o. female who presents to the [facility] for an injury that occurred at work, 2 days ago. 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 She states the air condition was broken at work 2 days ago causing her to feel light headed and fall on the right side of her body. She denies head injury, stating that her boss caught her head. She denies LOC or blacking out. She was unable to ambulate initially but was eventually able to stand with assistance of EMS. EMS check her blood sugar, which was normal, checked EKG, which was normal and took her BP, which she was told was elevated in the 200's. She denies taking BP medication or prev diagnosis of HTN. She was told by EMS that she possibly had a panic attack due to becoming overheated. She reports initial neck pain radiating to right shoulder pain after fall. She was seen that same day by her PCP.  She woke up yesterday day morning with worsening right neck pain radiating to her right shoulder as well as HA and light headedness. Today she reports worsening neck pain radiating to right shoulder but impoved HA and light headedness.  She denies nausea, emesis and visual changes.    No Known Allergies  Review of Systems  Eyes: Negative for visual disturbance.  Gastrointestinal: Negative for nausea and vomiting.  Musculoskeletal:  Positive for myalgias and neck pain. Negative for back pain, joint swelling and gait problem.  Skin: Negative for color change and wound.  Neurological: Positive for light-headedness and headaches. Negative for weakness and numbness.      Objective:   Physical Exam  Constitutional: She is oriented to person, place, and time. She appears well-developed and well-nourished. No distress.  HENT:  Head: Normocephalic and atraumatic. Head is without raccoon's eyes and without Battle's sign.  Right Ear: No hemotympanum.  Left Ear: No hemotympanum.  TM's pearly grey  Eyes: Conjunctivae and EOM are normal.  Neck: Neck supple.  Cardiovascular: Normal rate.   Pulmonary/Chest: Effort normal.  Musculoskeletal: Normal range of motion.  Tender along right paraspinal of the neck. As well right trapezius. No mid line bony tenderness. Slight guarded decrease extension 30-40 . Right and left rotation n is also limited to apro x 45. Lateral flexion 10 deg on right 10-15 on left.  No bony tendernes on the right shoulder. No tenderness to clavicle.   Neurological: She is alert and oriented to person, place, and time.  Reflex Scores:      Tricep reflexes are 2+ on the right side and 2+ on the left side.      Bicep reflexes are 2+ on the right side and 2+ on the left side.  Brachioradialis reflexes are 2+ on the right side and 2+ on the left side. Strength in arms equal with normal grip strength.   Skin: Skin is warm and dry.  Psychiatric: She has a normal mood and affect. Her behavior is normal.  Nursing note and vitals reviewed.  Filed Vitals:   02/06/15 0836  BP: 128/84  Pulse: 86  Temp: 98.3 F (36.8 C)  TempSrc: Oral  Resp: 16  Height: 5\' 4"  (1.626 m)  Weight: 214 lb (97.07 kg)  SpO2: 99%    [facility] reading (PRIMARY) by Dr. Neva Seat Cspine: Degenerative changes with spurring on C5 anteriorly. There straighten on cspine.. No apparent fx.   Assessment & Plan:   Wendy Everett is a 50 y.o.  female Neck pain on right side - Plan: DG Cervical Spine Complete, cyclobenzaprine (FLEXERIL) 5 MG tablet  Strain of neck muscle, initial encounter - Plan: DG Cervical Spine Complete, cyclobenzaprine (FLEXERIL) 5 MG tablet  Vaso vagal episode Suspected vasovagal episode with heat at work, and cervical strain/sprain on the right with fall. No apparent fracture or concerning bony findings on exam.  -Continue her usual Naprosyn, add Flexeril up to every 8 hours, heat or ice, gentle range of motion and temporary restrictions.   -Follow-up in 4 days. Sooner if worse.   -If any recurrence of headache or dizziness (this is improved today), should follow-up here emergency room. See letter for work restrictions.  Meds ordered this encounter  Medications  . DISCONTD: cyclobenzaprine (FLEXERIL) 10 MG tablet    Sig: Take 10 mg by mouth 3 (three) times daily as needed for muscle spasms.  . naproxen (NAPROSYN) 500 MG tablet    Sig: Take 500 mg by mouth 2 (two) times daily with a meal.  . cyclobenzaprine (FLEXERIL) 5 MG tablet    Sig: 1-2 tablets by mouth up to every 8 hours as needed. Start with one pill by mouth each bedtime as needed due to sedation    Dispense:  20 tablet    Refill:  0   Patient Instructions  You appear to have pulled a muscle in the neck. See information below. Okay to continue the naproxen that you're taking for your back, but add Flexeril up to 3 times per day. Can start the lower dose of 1 pill, but up to 2 pills at a time. This will make you sleepy, so do not drive or operate machinery while taking this medication. Heat or ice to the affected area, avoid overhead work and overhead reaching with the right arm for now. Follow-up with me in 4 days or if worse.  If you have any recurrence of the headaches, lightheadedness, dizziness, or otherwise worsening, go to the emergency room or return here.  Cervical Sprain A cervical sprain is an injury in the neck in which the strong,  fibrous tissues (ligaments) that connect your neck bones stretch or tear. Cervical sprains can range from mild to severe. Severe cervical sprains can cause the neck vertebrae to be unstable. This can lead to damage of the spinal cord and can result in serious nervous system problems. The amount of time it takes for a cervical sprain to get better depends on the cause and extent of the injury. Most cervical sprains heal in 1 to 3 weeks. CAUSES  Severe cervical sprains may be caused by:   Contact sport injuries (such as from football, rugby, wrestling, hockey, auto racing, gymnastics, diving, martial arts, or boxing).   Motor vehicle collisions.   Whiplash  injuries. This is an injury from a sudden forward and backward whipping movement of the head and neck.  Falls.  Mild cervical sprains may be caused by:   Being in an awkward position, such as while cradling a telephone between your ear and shoulder.   Sitting in a chair that does not offer proper support.   Working at a poorly Marketing executive station.   Looking up or down for long periods of time.  SYMPTOMS   Pain, soreness, stiffness, or a burning sensation in the front, back, or sides of the neck. This discomfort may develop immediately after the injury or slowly, 24 hours or more after the injury.   Pain or tenderness directly in the middle of the back of the neck.   Shoulder or upper back pain.   Limited ability to move the neck.   Headache.   Dizziness.   Weakness, numbness, or tingling in the hands or arms.   Muscle spasms.   Difficulty swallowing or chewing.   Tenderness and swelling of the neck.  DIAGNOSIS  Most of the time your health care provider can diagnose a cervical sprain by taking your history and doing a physical exam. Your health care provider will ask about previous neck injuries and any known neck problems, such as arthritis in the neck. X-rays may be taken to find out if there are  any other problems, such as with the bones of the neck. Other tests, such as a CT scan or MRI, may also be needed.  TREATMENT  Treatment depends on the severity of the cervical sprain. Mild sprains can be treated with rest, keeping the neck in place (immobilization), and pain medicines. Severe cervical sprains are immediately immobilized. Further treatment is done to help with pain, muscle spasms, and other symptoms and may include:  Medicines, such as pain relievers, numbing medicines, or muscle relaxants.   Physical therapy. This may involve stretching exercises, strengthening exercises, and posture training. Exercises and improved posture can help stabilize the neck, strengthen muscles, and help stop symptoms from returning.  HOME CARE INSTRUCTIONS   Put ice on the injured area.   Put ice in a plastic bag.   Place a towel between your skin and the bag.   Leave the ice on for 15-20 minutes, 3-4 times a day.   If your injury was severe, you may have been given a cervical collar to wear. A cervical collar is a two-piece collar designed to keep your neck from moving while it heals.  Do not remove the collar unless instructed by your health care provider.  If you have long hair, keep it outside of the collar.  Ask your health care provider before making any adjustments to your collar. Minor adjustments may be required over time to improve comfort and reduce pressure on your chin or on the back of your head.  Ifyou are allowed to remove the collar for cleaning or bathing, follow your health care provider's instructions on how to do so safely.  Keep your collar clean by wiping it with mild soap and water and drying it completely. If the collar you have been given includes removable pads, remove them every 1-2 days and hand wash them with soap and water. Allow them to air dry. They should be completely dry before you wear them in the collar.  If you are allowed to remove the collar for  cleaning and bathing, wash and dry the skin of your neck. Check your skin for irritation  or sores. If you see any, tell your health care provider.  Do not drive while wearing the collar.   Only take over-the-counter or prescription medicines for pain, discomfort, or fever as directed by your health care provider.   Keep all follow-up appointments as directed by your health care provider.   Keep all physical therapy appointments as directed by your health care provider.   Make any needed adjustments to your workstation to promote good posture.   Avoid positions and activities that make your symptoms worse.   Warm up and stretch before being active to help prevent problems.  SEEK MEDICAL CARE IF:   Your pain is not controlled with medicine.   You are unable to decrease your pain medicine over time as planned.   Your activity level is not improving as expected.  SEEK IMMEDIATE MEDICAL CARE IF:   You develop any bleeding.  You develop stomach upset.  You have signs of an allergic reaction to your medicine.   Your symptoms get worse.   You develop new, unexplained symptoms.   You have numbness, tingling, weakness, or paralysis in any part of your body.  MAKE SURE YOU:   Understand these instructions.  Will watch your condition.  Will get help right away if you are not doing well or get worse.   This information is not intended to replace advice given to you by your health care provider. Make sure you discuss any questions you have with your health care provider.   Document Released: 10/31/2006 Document Revised: 01/08/2013 Document Reviewed: 07/11/2012 Elsevier Interactive Patient Education [employer]     I personally performed the services described in this documentation, which was scribed in my presence. The recorded information has been reviewed and considered, and addended by me as needed.

## 2015-02-06 NOTE — Patient Instructions (Signed)
You appear to have pulled a muscle in the neck. See information below. Okay to continue the naproxen that you're taking for your back, but add Flexeril up to 3 times per day. Can start the lower dose of 1 pill, but up to 2 pills at a time. This will make you sleepy, so do not drive or operate machinery while taking this medication. Heat or ice to the affected area, avoid overhead work and overhead reaching with the right arm for now. Follow-up with me in 4 days or if worse.  If you have any recurrence of the headaches, lightheadedness, dizziness, or otherwise worsening, go to the emergency room or return here.  Cervical Sprain A cervical sprain is an injury in the neck in which the strong, fibrous tissues (ligaments) that connect your neck bones stretch or tear. Cervical sprains can range from mild to severe. Severe cervical sprains can cause the neck vertebrae to be unstable. This can lead to damage of the spinal cord and can result in serious nervous system problems. The amount of time it takes for a cervical sprain to get better depends on the cause and extent of the injury. Most cervical sprains heal in 1 to 3 weeks. CAUSES  Severe cervical sprains may be caused by:   Contact sport injuries (such as from football, rugby, wrestling, hockey, auto racing, gymnastics, diving, martial arts, or boxing).   Motor vehicle collisions.   Whiplash injuries. This is an injury from a sudden forward and backward whipping movement of the head and neck.  Falls.  Mild cervical sprains may be caused by:   Being in an awkward position, such as while cradling a telephone between your ear and shoulder.   Sitting in a chair that does not offer proper support.   Working at a poorly Marketing executive station.   Looking up or down for long periods of time.  SYMPTOMS   Pain, soreness, stiffness, or a burning sensation in the front, back, or sides of the neck. This discomfort may develop immediately after  the injury or slowly, 24 hours or more after the injury.   Pain or tenderness directly in the middle of the back of the neck.   Shoulder or upper back pain.   Limited ability to move the neck.   Headache.   Dizziness.   Weakness, numbness, or tingling in the hands or arms.   Muscle spasms.   Difficulty swallowing or chewing.   Tenderness and swelling of the neck.  DIAGNOSIS  Most of the time your health care provider can diagnose a cervical sprain by taking your history and doing a physical exam. Your health care provider will ask about previous neck injuries and any known neck problems, such as arthritis in the neck. X-rays may be taken to find out if there are any other problems, such as with the bones of the neck. Other tests, such as a CT scan or MRI, may also be needed.  TREATMENT  Treatment depends on the severity of the cervical sprain. Mild sprains can be treated with rest, keeping the neck in place (immobilization), and pain medicines. Severe cervical sprains are immediately immobilized. Further treatment is done to help with pain, muscle spasms, and other symptoms and may include:  Medicines, such as pain relievers, numbing medicines, or muscle relaxants.   Physical therapy. This may involve stretching exercises, strengthening exercises, and posture training. Exercises and improved posture can help stabilize the neck, strengthen muscles, and help stop symptoms from returning.  HOME CARE INSTRUCTIONS   Put ice on the injured area.   Put ice in a plastic bag.   Place a towel between your skin and the bag.   Leave the ice on for 15-20 minutes, 3-4 times a day.   If your injury was severe, you may have been given a cervical collar to wear. A cervical collar is a two-piece collar designed to keep your neck from moving while it heals.  Do not remove the collar unless instructed by your health care provider.  If you have long hair, keep it outside of the  collar.  Ask your health care provider before making any adjustments to your collar. Minor adjustments may be required over time to improve comfort and reduce pressure on your chin or on the back of your head.  Ifyou are allowed to remove the collar for cleaning or bathing, follow your health care provider's instructions on how to do so safely.  Keep your collar clean by wiping it with mild soap and water and drying it completely. If the collar you have been given includes removable pads, remove them every 1-2 days and hand wash them with soap and water. Allow them to air dry. They should be completely dry before you wear them in the collar.  If you are allowed to remove the collar for cleaning and bathing, wash and dry the skin of your neck. Check your skin for irritation or sores. If you see any, tell your health care provider.  Do not drive while wearing the collar.   Only take over-the-counter or prescription medicines for pain, discomfort, or fever as directed by your health care provider.   Keep all follow-up appointments as directed by your health care provider.   Keep all physical therapy appointments as directed by your health care provider.   Make any needed adjustments to your workstation to promote good posture.   Avoid positions and activities that make your symptoms worse.   Warm up and stretch before being active to help prevent problems.  SEEK MEDICAL CARE IF:   Your pain is not controlled with medicine.   You are unable to decrease your pain medicine over time as planned.   Your activity level is not improving as expected.  SEEK IMMEDIATE MEDICAL CARE IF:   You develop any bleeding.  You develop stomach upset.  You have signs of an allergic reaction to your medicine.   Your symptoms get worse.   You develop new, unexplained symptoms.   You have numbness, tingling, weakness, or paralysis in any part of your body.  MAKE SURE YOU:   Understand  these instructions.  Will watch your condition.  Will get help right away if you are not doing well or get worse.   This information is not intended to replace advice given to you by your health care provider. Make sure you discuss any questions you have with your health care provider.   Document Released: 10/31/2006 Document Revised: 01/08/2013 Document Reviewed: 07/11/2012 Elsevier Interactive Patient Education [employer]

## 2015-02-10 ENCOUNTER — Ambulatory Visit (INDEPENDENT_AMBULATORY_CARE_PROVIDER_SITE_OTHER): Payer: Worker's Compensation | Admitting: Family Medicine

## 2015-02-10 VITALS — BP 138/80 | HR 88 | Temp 98.5°F | Resp 16 | Ht 64.0 in | Wt 216.0 lb

## 2015-02-10 DIAGNOSIS — S139XXD Sprain of joints and ligaments of unspecified parts of neck, subsequent encounter: Secondary | ICD-10-CM

## 2015-02-10 DIAGNOSIS — M542 Cervicalgia: Secondary | ICD-10-CM

## 2015-02-10 DIAGNOSIS — M62838 Other muscle spasm: Secondary | ICD-10-CM

## 2015-02-10 DIAGNOSIS — M6248 Contracture of muscle, other site: Secondary | ICD-10-CM | POA: Diagnosis not present

## 2015-02-10 MED ORDER — TRAMADOL HCL 50 MG PO TABS: 50.0000 mg | ORAL_TABLET | Freq: Four times a day (QID) | ORAL | Status: DC | PRN

## 2015-02-10 NOTE — Patient Instructions (Signed)
Okay to continue Flexeril 1-2 up to every 8 hours, heat or ice to the side of her neck, and gentle stretches if that feels better. If you do need something stronger for pain, I did write tramadol to be used every 6 hours. Be very careful combining this with the Flexeril as both can cause sedation and put you at risk for falls. Do not drive or operate heavy machinery while you are taking these medications. I did refer you to orthopedics, and also ordered the MRI of your neck to determine if herniated disc or other cause of your pain is present.  Return to the clinic or go to the nearest emergency room if any of your symptoms worsen or new symptoms occur.

## 2015-02-10 NOTE — Progress Notes (Signed)
Subjective:  This chart was scribed for [name] MD, by Veverly Fells, at [facility].  This patient was seen in room 13 and the patient's care was started at 9:40 AM.   Chief Complaint  Patient presents with  . Follow-up    neck pain , x 6 days Workers Comp      Patient ID: Wendy Everett, female    DOB: 1965-11-18, 50 y.o.   MRN: [id number]  HPI  HPI Comments: Wendy Everett is a 50 y.o. female who presents to the [facility] for a follow up due to injury at work (02/04/2015) .  She was seen four days ago for neck pain, suspected strain of the neck after a vaso vagal episode at work.  She was continued on naprosyn, added flexeril.  Symptomatic care and work restrictions were provided.  She works as a Merchandiser, retail at [employer].    Patient notes that she is sleepy today.  She feels that the pain has worsened on the front part of her neck to the upper chest area and side of her neck.  This pain has kept her from sleeping for the past two nights and she has been up since 3 am this morning.  Her pain increases when she lays down to sleep so she has to sit upright.  Two days ago, the right side of her neck started burning and was radiating to her shoulder.  She is complaint with her Flexeril ( is taking it every 8 hours- 1 pill at a time).  She is also complaint with taking her Naprosyn 2 times per day.    She states that her stomach was upset yesterday and felt nauseas so she drank ginger ale.   Patient has no history of seizures.    X ray reading of cervical spine- multiple level spondylosis without acute abnormality.    Patient Active Problem List   Diagnosis Date Noted  . Irritable bowel syndrome with constipation 03/02/2011  . Chronic cholecystitis without calculus 01/03/2011  . Rib pain, L chest wall 01/03/2011  . GERD (gastroesophageal reflux disease) 01/03/2011  . Obesity (BMI 30-39.9)  01/03/2011   Past Medical History  Diagnosis Date  . Heart murmur   . GERD (gastroesophageal reflux disease)   . Cyst     on kidney  . Chronic cholecystitis without calculus 01/03/2011  . Irritable bowel syndrome with constipation 03/02/2011   Past Surgical History  Procedure Laterality Date  . Cesarean section    . Abdominal hysterectomy      partial  . Tonsillectomy    . Foot surgery      left  . Laparoscopic cholecystectomy  02/11/11    single site   No Known Allergies Prior to Admission medications   Medication Sig Start Date End Date Taking? Authorizing Provider  cyclobenzaprine (FLEXERIL) 5 MG tablet 1-2 tablets by mouth up to every 8 hours as needed. Start with one pill by mouth each bedtime as needed due to sedation 02/06/15  Yes Shade Flood, MD  DEXILANT 60 MG capsule Take 60 mg by mouth daily. Reported on 02/06/2015 02/25/11  Yes Historical Provider, MD  naproxen (NAPROSYN) 500 MG tablet Take 500 mg by mouth 2 (two) times daily with a meal.   Yes Historical Provider, MD   Social History   Social History  . Marital Status: Married    Spouse Name: N/A  . Number of Children:  N/A  . Years of Education: N/A   Occupational History  . Not on file.   Social History Main Topics  . Smoking status: Never Smoker   . Smokeless tobacco: Never Used  . Alcohol Use: No  . Drug Use: No  . Sexual Activity: Not on file   Other Topics Concern  . Not on file   Social History Narrative        Review of Systems  Constitutional: Negative for fever and chills.  Eyes: Negative for pain, redness and itching.  Respiratory: Negative for cough and choking.   Gastrointestinal: Positive for nausea. Negative for vomiting.  Musculoskeletal: Positive for myalgias and neck pain.  Skin: Negative for color change.  Neurological: Negative for syncope and speech difficulty.       Objective:   Physical Exam  Constitutional: She is oriented to person, place, and time. No distress.   HENT:  Head: Normocephalic and atraumatic.  Eyes: Pupils are equal, round, and reactive to light.  Neck:  c spine- she has 5-10 deg of flexion and extension.  guarded in exam 10- 15 deg of rotation to the right. 5- 10 deg left rotation.  appriximately 10 deg of lateral flexion.   Complains of diffuse pain across the neck even with light touch She retracts from light touch on right neck, unable to palpate with just touching  She retracts from light touch also on anterior neck and dorsal shoulder  Pulmonary/Chest: No respiratory distress.  Neurological: She is alert and oriented to person, place, and time.  Reflex Scores:      Bicep reflexes are 2+ on the right side and 2+ on the left side.      Brachioradialis reflexes are 2+ on the right side and 2+ on the left side. Equal grip strength  Equal strength in right and left upper extremities.  Some guarding with reflexes   Difficult to obtain tricep reflexes bilaterally.   Skin: Skin is warm and dry.  Skin is intact, no erythema, no rash.     Filed Vitals:   02/10/15 0905  BP: 138/80  Pulse: 88  Temp: 98.5 F (36.9 C)  TempSrc: Oral  Resp: 16  Height:  (1.626 m)  Weight: 216 lb (97.977 kg)  SpO2: 90%      Assessment & Plan:   Wendy Everett is a 50 y.o. female Neck pain on right side - Plan: traMADol (ULTRAM) 50 MG tablet, Ambulatory referral to Orthopedic Surgery, MR Cervical Spine Wo Contrast  Neck sprain and strain, subsequent encounter  Muscle spasms of neck  Neck pain/strain due to injury at work on 02/04/2015. Worsening of pain the past 2 days, with fatigue secondary to not sleeping well with the pain. Has been taking Naprosyn and Flexeril as instructed. Guarded exam with pain and retracting from exam with light touch.  -Will add tramadol as needed for pain, but cautioned on combined sedation and risk of fall with this and Flexeril. Use lowest effective dose of both medications.   -refer to orthopedics, and  arrange for MRI to determine if HNP or other underlying cause of increased pain while we wait for ortho evaluation.    -out of work right now due to pain and sedation from medications. RTW to be determined by orthopedics.  - RTC/ER precautions were discussed.   Meds ordered this encounter  Medications  . traMADol (ULTRAM) 50 MG tablet    Sig: Take 1 tablet (50 mg total) by mouth every 6 (six) hours  as needed.    Dispense:  20 tablet    Refill:  0   Patient Instructions  Okay to continue Flexeril 1-2 up to every 8 hours, heat or ice to the side of her neck, and gentle stretches if that feels better. If you do need something stronger for pain, I did write tramadol to be used every 6 hours. Be very careful combining this with the Flexeril as both can cause sedation and put you at risk for falls. Do not drive or operate heavy machinery while you are taking these medications. I did refer you to orthopedics, and also ordered the MRI of your neck to determine if herniated disc or other cause of your pain is present.  Return to the clinic or go to the nearest emergency room if any of your symptoms worsen or new symptoms occur.      I personally performed the services described in this documentation, which was scribed in my presence. The recorded information has been reviewed and considered, and addended by me as needed.

## 2015-02-12 ENCOUNTER — Telehealth: Payer: Self-pay

## 2015-02-12 NOTE — Telephone Encounter (Signed)
Pending payment of $10.00 for 18 pages. Letter faxed to [facility] on 02/12/2015.

## 2015-02-19 ENCOUNTER — Telehealth: Payer: Self-pay

## 2015-02-19 NOTE — Telephone Encounter (Signed)
Patient requesting a copy of her xrays done on 02/06/15 to be burned on a cd. She has an appointment on feb 8th at [facility]. Please call patient at [phone] when ready to be picked up

## 2015-02-23 ENCOUNTER — Telehealth: Payer: Self-pay | Admitting: Family Medicine

## 2015-02-23 NOTE — Telephone Encounter (Signed)
Pt notified of results. And copy of xray made.  Pt has copy of MRI to take to [facility] ortho.

## 2015-02-23 NOTE — Telephone Encounter (Signed)
Pt confirmed 02/25/15 orthopedic appointment//Xray CD has been picked up by patient  [phone]// no further action req. Call can be closed

## 2015-02-23 NOTE — Telephone Encounter (Signed)
MRI of cervical spine without contrast, report reviewed from [facility], performed on 02/18/2015. Impression: Mild cervical spondylosis without significant spinal canal or nerve root encroachment.  Please call patient  - advise of results. No significant herniated disc or concerning findings on her MRI. There were some mild degenerative changes. Follow-up with orthopedist as planned.  Please send copy to orthopedist where she has an appointment in 2 days, then scan into [facility].

## 2015-02-25 ENCOUNTER — Telehealth: Payer: Self-pay

## 2015-02-25 NOTE — Telephone Encounter (Signed)
Patient needs FMLA forms completed and faxed back to [place] at [phone]. I have filled out what I can from the OV notes and highlighted what needs to be completed. Please return to the FMLA/Disability box at the 102 checkout desk with in 5-7 business days. I will place in your box on 02/25/15.

## 2015-03-02 DIAGNOSIS — Z0271 Encounter for disability determination: Secondary | ICD-10-CM

## 2015-03-03 NOTE — Telephone Encounter (Signed)
Paperwork scanned and faxed on 03/03/15

## 2015-03-03 NOTE — Telephone Encounter (Signed)
Paperwork completed. Ready for pick up.

## 2018-02-10 ENCOUNTER — Encounter (HOSPITAL_COMMUNITY): Payer: Self-pay | Admitting: Emergency Medicine

## 2018-02-10 ENCOUNTER — Emergency Department (HOSPITAL_COMMUNITY): Payer: No Typology Code available for payment source

## 2018-02-10 ENCOUNTER — Other Ambulatory Visit: Payer: Self-pay

## 2018-02-10 ENCOUNTER — Emergency Department (HOSPITAL_COMMUNITY)
Admission: EM | Admit: 2018-02-10 | Discharge: 2018-02-10 | Disposition: A | Discharge status: Home / Self Care | Payer: No Typology Code available for payment source | Attending: Emergency Medicine | Admitting: Emergency Medicine

## 2018-02-10 DIAGNOSIS — R0789 Other chest pain: Secondary | ICD-10-CM | POA: Diagnosis not present

## 2018-02-10 DIAGNOSIS — Z9049 Acquired absence of other specified parts of digestive tract: Secondary | ICD-10-CM | POA: Insufficient documentation

## 2018-02-10 DIAGNOSIS — S199XXA Unspecified injury of neck, initial encounter: Secondary | ICD-10-CM | POA: Diagnosis present

## 2018-02-10 DIAGNOSIS — Z79899 Other long term (current) drug therapy: Secondary | ICD-10-CM | POA: Insufficient documentation

## 2018-02-10 DIAGNOSIS — Y9389 Activity, other specified: Secondary | ICD-10-CM | POA: Insufficient documentation

## 2018-02-10 DIAGNOSIS — Y92410 Unspecified street and highway as the place of occurrence of the external cause: Secondary | ICD-10-CM | POA: Diagnosis not present

## 2018-02-10 DIAGNOSIS — Y998 Other external cause status: Secondary | ICD-10-CM | POA: Insufficient documentation

## 2018-02-10 DIAGNOSIS — S161XXA Strain of muscle, fascia and tendon at neck level, initial encounter: Secondary | ICD-10-CM

## 2018-02-10 MED ORDER — NAPROXEN 500 MG PO TABS: 500.0000 mg | ORAL_TABLET | Freq: Two times a day (BID) | ORAL | 0 refills | Status: DC

## 2018-02-10 MED ORDER — METHOCARBAMOL 500 MG PO TABS: 500.0000 mg | ORAL_TABLET | Freq: Two times a day (BID) | ORAL | 0 refills | Status: DC

## 2018-02-10 MED ORDER — HYDROCODONE-ACETAMINOPHEN 5-325 MG PO TABS
1.0000 | ORAL_TABLET | Freq: Once | ORAL | Status: AC
  Administered 2018-02-10: 1 via ORAL
  Filled 2018-02-10: qty 1

## 2018-02-10 MED ORDER — METHOCARBAMOL 500 MG PO TABS
500.0000 mg | ORAL_TABLET | Freq: Once | ORAL | Status: AC
  Administered 2018-02-10: 500 mg via ORAL
  Filled 2018-02-10: qty 1

## 2018-02-10 NOTE — Discharge Instructions (Addendum)
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
## 2018-11-19 ENCOUNTER — Other Ambulatory Visit: Payer: Self-pay

## 2018-11-19 ENCOUNTER — Encounter (HOSPITAL_COMMUNITY): Payer: Self-pay

## 2018-11-19 ENCOUNTER — Ambulatory Visit (HOSPITAL_COMMUNITY)
Admission: EM | Admit: 2018-11-19 | Discharge: 2018-11-19 | Disposition: A | Discharge status: Home / Self Care | Payer: Self-pay

## 2018-11-19 DIAGNOSIS — K0889 Other specified disorders of teeth and supporting structures: Secondary | ICD-10-CM

## 2018-11-19 MED ORDER — IBUPROFEN 800 MG PO TABS: 800.0000 mg | ORAL_TABLET | Freq: Three times a day (TID) | ORAL | 0 refills | Status: DC | PRN

## 2018-11-19 MED ORDER — HYDROCODONE-ACETAMINOPHEN 5-325 MG PO TABS: 2.0000 | ORAL_TABLET | ORAL | 0 refills | Status: DC | PRN

## 2018-11-19 MED ORDER — AMOXICILLIN 500 MG PO CAPS: 500.0000 mg | ORAL_CAPSULE | Freq: Three times a day (TID) | ORAL | 0 refills | Status: DC

## 2018-11-19 NOTE — ED Triage Notes (Signed)
Pt states having dental pain x 5 days. Pt states her right side of her face is painful and swollen x 5 days. Pt report she can not eat, is painful to open her mouth.

## 2018-11-19 NOTE — ED Provider Notes (Addendum)
[facility]    CSN: [id number] Arrival date & time: 11/19/18  1450      History   Chief Complaint Chief Complaint  Patient presents with  . Dental Pain  . Facial Swelling    HPI Wendy Everett is a 53 y.o. female.   Patient presents with right upper tooth pain and right side facial swelling x5 days.  She has been treating this at home with ice packs and ibuprofen with minimal relief.  She called her dentist and was told to come here today; she has an appointment to be seen tomorrow.  She denies fever, chills, difficulty swallowing, difficulty breathing, or other symptoms.  The history is provided by the patient.    Past Medical History:  Diagnosis Date  . Chronic cholecystitis without calculus 01/03/2011  . Cyst    on kidney  . GERD (gastroesophageal reflux disease)   . Heart murmur   . Irritable bowel syndrome with constipation 03/02/2011    Patient Active Problem List   Diagnosis Date Noted  . Irritable bowel syndrome with constipation 03/02/2011  . Chronic cholecystitis without calculus 01/03/2011  . Rib pain, L chest wall 01/03/2011  . GERD (gastroesophageal reflux disease) 01/03/2011  . Obesity (BMI 30-39.9) 01/03/2011    Past Surgical History:  Procedure Laterality Date  . ABDOMINAL HYSTERECTOMY     partial  . CESAREAN SECTION    . FOOT SURGERY     left  . LAPAROSCOPIC CHOLECYSTECTOMY  02/11/11   single site  . TONSILLECTOMY      OB History   No obstetric history on file.      Home Medications    Prior to Admission medications   Medication Sig Start Date End Date Taking? Authorizing Provider  amoxicillin (AMOXIL) 500 MG capsule Take 1 capsule (500 mg total) by mouth 3 (three) times daily. 11/19/18   Mickie Bailate, Devonta Blanford H, NP  diphenhydrAMINE (BENADRYL) 25 MG tablet Take 50 mg by mouth every 6 (six) hours as needed for itching or sleep.    [provider]  diphenhydramine-acetaminophen (TYLENOL PM) 25-500 MG TABS tablet Take 2 tablets by  mouth at bedtime as needed (sleep).    [provider]  esomeprazole (NEXIUM) 40 MG capsule Take 40 mg by mouth daily at 12 noon.    [provider]  HYDROcodone-acetaminophen (NORCO/VICODIN) 5-325 MG tablet Take 2 tablets by mouth every 4 (four) hours as needed. 11/19/18   Mickie Bailate, Nabeeha Badertscher H, NP  ibuprofen (ADVIL) 800 MG tablet Take 1 tablet (800 mg total) by mouth every 8 (eight) hours as needed. 11/19/18   Mickie Bailate, Zamier Eggebrecht H, NP  methocarbamol (ROBAXIN) 500 MG tablet Take 1 tablet (500 mg total) by mouth 2 (two) times daily. 02/10/18   Muthersbaugh, Dahlia ClientHannah, PA-C  naproxen (NAPROSYN) 500 MG tablet Take 1 tablet (500 mg total) by mouth 2 (two) times daily with a meal. 02/10/18   Muthersbaugh, Dahlia ClientHannah, PA-C    Family History Family History  Problem Relation Age of Onset  . Heart disease Mother     Social History Social History   Tobacco Use  . Smoking status: Never Smoker  . Smokeless tobacco: Never Used  Substance Use Topics  . Alcohol use: No    Alcohol/week: 0.0 standard drinks  . Drug use: No     Allergies   Patient has no known allergies.   Review of Systems Review of Systems  Constitutional: Negative for chills and fever.  HENT: Positive for dental problem and facial swelling. Negative  for ear pain, sore throat and trouble swallowing.   Eyes: Negative for pain and visual disturbance.  Respiratory: Negative for cough and shortness of breath.   Cardiovascular: Negative for chest pain and palpitations.  Gastrointestinal: Negative for abdominal pain and vomiting.  Genitourinary: Negative for dysuria and hematuria.  Musculoskeletal: Negative for arthralgias and back pain.  Skin: Negative for color change and rash.  Neurological: Negative for seizures and syncope.  All other systems reviewed and are negative.    Physical Exam Triage Vital Signs ED Triage Vitals  Enc Vitals Group     BP      Pulse      Resp      Temp      Temp src      SpO2      Weight       Height      Head Circumference      Peak Flow      Pain Score      Pain Loc      Pain Edu?      Excl. in [place]?    No data found.  Updated Vital Signs BP (!) 172/91 (BP Location: Right Arm)   Pulse 71   Temp 98.5 F (36.9 C) (Temporal)   Resp 16   SpO2 96%   Visual Acuity Right Eye Distance:   Left Eye Distance:   Bilateral Distance:    Right Eye Near:   Left Eye Near:    Bilateral Near:     Physical Exam Vitals signs and nursing note reviewed.  Constitutional:      General: She is not in acute distress.    Appearance: She is well-developed.  HENT:     Head: Normocephalic and atraumatic.      Right Ear: Tympanic membrane normal.     Left Ear: Tympanic membrane normal.     Mouth/Throat:     Mouth: Mucous membranes are moist.     Dentition: Dental tenderness present.     Pharynx: Oropharynx is clear.      Comments: Right side facial swelling.  Able to swallow and talk without difficulty.  Eyes:     Conjunctiva/sclera: Conjunctivae normal.  Neck:     Musculoskeletal: Neck supple.  Cardiovascular:     Rate and Rhythm: Normal rate and regular rhythm.     Heart sounds: No murmur.  Pulmonary:     Effort: Pulmonary effort is normal. No respiratory distress.     Breath sounds: Normal breath sounds.  Abdominal:     Palpations: Abdomen is soft.     Tenderness: There is no abdominal tenderness.  Skin:    General: Skin is warm and dry.  Neurological:     Mental Status: She is alert.      UC Treatments / Results  Labs (all labs ordered are listed, but only abnormal results are displayed) Labs Reviewed - No data to display  EKG   Radiology No results found.  Procedures Procedures (including critical care time)  Medications Ordered in UC Medications - No data to display  Initial Impression / Assessment and Plan / UC Course  I have reviewed the triage vital signs and the nursing notes.  Pertinent labs & imaging results that were available during my care  of the patient were reviewed by me and considered in my medical decision making (see chart for details).    Dental pain.  Treating with amoxicillin and ibuprofen.  Additionally, 6 tablets of Norco prescribed for severe  pain.  Discussed precautions for drowsiness with Norco with patient.  Instructed her to call her dentist office to see if they want her to reschedule her appointment for after she has completed the antibiotics.  If not, instructed her to follow-up with her dentist as scheduled tomorrow.  Dental resource guide provided.  Instructed patient to return here or go to the ED if she has increased swelling or develops new symptoms such as fever, difficulty swallowing, difficulty breathing, or other concerns.  Discussed with patient that her blood pressure is elevated today; patient states her doctor is aware of this and that her blood pressure is always elevated when she is in a medical office.  Instructed patient to follow-up with her PCP in 2 to 4 weeks to have this rechecked.  Patient agrees to plan of care.     Final Clinical Impressions(s) / UC Diagnoses   Final diagnoses:  Pain, dental     Discharge Instructions     Take the amoxicillin and ibuprofen as directed.  Take the prescribed pain medication Norco as needed for severe pain.  Do not drive, operate machinery, or drink alcohol with this medication as it may cause drowsiness.    Follow-up with your dentist as scheduled tomorrow.  Call them today to see if they want you to complete the antibiotic before coming in for an appointment.    A dental resource guide is attached for you if needed.    Return here or go to the emergency department if you have increased facial swelling or develop new symptoms such as fever, difficulty swallowing, difficulty breathing, or other concerns.        ED Prescriptions    Medication Sig Dispense Auth. Provider   amoxicillin (AMOXIL) 500 MG capsule Take 1 capsule (500 mg total) by mouth 3  (three) times daily. 21 capsule Wendee Beavers H, NP   ibuprofen (ADVIL) 800 MG tablet Take 1 tablet (800 mg total) by mouth every 8 (eight) hours as needed. 21 tablet Mickie Bail, NP   HYDROcodone-acetaminophen (NORCO/VICODIN) 5-325 MG tablet Take 2 tablets by mouth every 4 (four) hours as needed. 6 tablet Mickie Bail, NP     I have reviewed the PDMP during this encounter.   Mickie Bail, NP 11/19/18 1535    Mickie Bail, NP 11/19/18 1536

## 2018-11-19 NOTE — ED Notes (Signed)
BP 172/91 was reported to [employer].

## 2018-11-19 NOTE — Discharge Instructions (Signed)
Take the amoxicillin and ibuprofen as directed.  Take the prescribed pain medication Norco as needed for severe pain.  Do not drive, operate machinery, or drink alcohol with this medication as it may cause drowsiness.    Follow-up with your dentist as scheduled tomorrow.  Call them today to see if they want you to complete the antibiotic before coming in for an appointment.    A dental resource guide is attached for you if needed.    Return here or go to the emergency department if you have increased facial swelling or develop new symptoms such as fever, difficulty swallowing, difficulty breathing, or other concerns.

## 2019-09-23 ENCOUNTER — Other Ambulatory Visit: Payer: Self-pay

## 2019-09-23 ENCOUNTER — Emergency Department (HOSPITAL_BASED_OUTPATIENT_CLINIC_OR_DEPARTMENT_OTHER)
Admission: EM | Admit: 2019-09-23 | Discharge: 2019-09-23 | Disposition: A | Discharge status: Home / Self Care | Payer: No Typology Code available for payment source | Attending: Emergency Medicine | Admitting: Emergency Medicine

## 2019-09-23 ENCOUNTER — Emergency Department (HOSPITAL_BASED_OUTPATIENT_CLINIC_OR_DEPARTMENT_OTHER): Payer: No Typology Code available for payment source

## 2019-09-23 ENCOUNTER — Encounter (HOSPITAL_BASED_OUTPATIENT_CLINIC_OR_DEPARTMENT_OTHER): Payer: Self-pay | Admitting: *Deleted

## 2019-09-23 DIAGNOSIS — Y990 Civilian activity done for income or pay: Secondary | ICD-10-CM | POA: Diagnosis not present

## 2019-09-23 DIAGNOSIS — W228XXA Striking against or struck by other objects, initial encounter: Secondary | ICD-10-CM | POA: Diagnosis not present

## 2019-09-23 DIAGNOSIS — Y939 Activity, unspecified: Secondary | ICD-10-CM | POA: Insufficient documentation

## 2019-09-23 DIAGNOSIS — Y929 Unspecified place or not applicable: Secondary | ICD-10-CM | POA: Insufficient documentation

## 2019-09-23 DIAGNOSIS — S6992XA Unspecified injury of left wrist, hand and finger(s), initial encounter: Secondary | ICD-10-CM | POA: Diagnosis present

## 2019-09-23 DIAGNOSIS — S63502A Unspecified sprain of left wrist, initial encounter: Secondary | ICD-10-CM | POA: Diagnosis not present

## 2019-09-23 NOTE — Discharge Instructions (Addendum)
You are seen today for a sprain, I want you to use the attached instructions.  Continue to use ibuprofen as prescribed on the bottle for pain and inflammation.   Tests performed today include: An x-ray of the affected area - does NOT show any broken bones or dislocations.  Vital signs. See below for your results today.   Home care instructions: -- *PRICE in the first 24-48 hours after injury: Protect (with brace, splint, sling), if given by your provider Rest Ice- Do not apply ice pack directly to your skin, place towel or similar between your skin and ice/ice pack. Apply ice for 20 min, then remove for 40 min while awake Compression- Wear brace, elastic bandage, splint as directed by your provider Elevate affected extremity above the level of your heart when not walking around for the first 24-48 hours   Use Ibuprofen (Motrin/Advil) 600mg  every 6 hours as needed for pain (do not exceed max dose in 24 hours, 2400mg )  Follow-up instructions: Please follow-up with your primary care provider or the provided orthopedic physician (bone specialist) if you continue to have significant pain in 1 week. In this case you may have a more severe injury that requires further care.   Return instructions:  Please return if your toes or feet are numb or tingling, appear gray or blue, or you have severe pain (also elevate the leg and loosen splint or wrap if you were given one) Please return to the Emergency Department if you experience worsening symptoms.  Please return if you have any other emergent concerns. Additional Information:  Your vital signs today were: BP (!) 125/91   Pulse 98   Temp 98 F (36.7 C)   Resp 18   Ht 5\' 4"  (1.626 m)   Wt 93.9 kg   SpO2 100%   BMI 35.53 kg/m  If your blood pressure (BP) was elevated above 135/85 this visit, please have this repeated by your doctor within one month. ---------------

## 2019-09-23 NOTE — ED Triage Notes (Signed)
Pt c/o left wrist injury x 3 hrs
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
## 2019-09-25 ENCOUNTER — Telehealth: Payer: Self-pay | Admitting: Family Medicine

## 2019-09-25 NOTE — Telephone Encounter (Signed)
Called patient to offer ED follow-up care for wrist sprain--Left msg to call office.  --glh

## 2020-09-07 NOTE — Progress Notes (Signed)
Please enter orders for PAT visit scheduled for 09-10-20. 

## 2020-09-08 NOTE — Patient Instructions (Signed)
DUE TO COVID-19 ONLY ONE VISITOR IS ALLOWED TO COME WITH YOU AND STAY IN THE WAITING ROOM ONLY DURING PRE OP AND PROCEDURE.   **NO VISITORS ARE ALLOWED IN THE SHORT STAY AREA OR RECOVERY ROOM!!**         Your procedure is scheduled on:  Tuesday, 09-05-20   Report to [facility] Main  Entrance   Report to admitting at 10:15 AM   Call this number if you have problems the morning of surgery [phone]   Do not eat food :After Midnight.   May have liquids until 9:30 AM  day of surgery  CLEAR LIQUID DIET  Foods Allowed                                                                     Foods Excluded  Water, Black Coffee and tea, regular and decaf              liquids that you cannot  Plain Jell-O in any flavor  (No red)                                    see through such as: Fruit ices (not with fruit pulp)                                      milk, soups, orange juice              Iced Popsicles (No red)                                      All solid food                                   Apple juices Sports drinks like Gatorade (No red) Lightly seasoned clear broth or consume(fat free) Sugar, honey syrup     Complete one Ensure drink the morning of surgery at  9:30 AM  the day of surgery.       The day of surgery:  Drink ONE (1) Pre-Surgery Clear Ensure the morning of surgery. Drink in one sitting. Do not sip.  This drink was given to you during your hospital  pre-op appointment visit. Nothing else to drink after completing the Pre-Surgery Clear Ensure .          If you have questions, please contact your surgeon's office.     Oral Hygiene is also important to reduce your risk of infection.                                    Remember - BRUSH YOUR TEETH THE MORNING OF SURGERY WITH YOUR REGULAR TOOTHPASTE   Do NOT smoke after Midnight   Take these medicines the morning of surgery with A SIP OF WATER:  Nexium, Gabapentin, Rosuvastatin.  Hydrocodone if needed  You may not have any metal on your body including hair pins, jewelry, and body piercing             Do not wear make-up, lotions, powders, perfumes or deodorant  Do not wear nail polish including gel and S&S, artificial/acrylic nails, or any other type of covering on natural nails including finger and toenails. If you have artificial nails, gel coating, etc. that needs to be removed by a nail salon please have this removed prior to surgery or surgery may need to be canceled/ delayed if the surgeon/ anesthesia feels like they are unable to be safely monitored.   Do not shave  48 hours prior to surgery.             Do not bring valuables to the hospital. [facility] IS NOT RESPONSIBLE   FOR VALUABLES.   Contacts, dentures or bridgework may not be worn into surgery.    Patients discharged the day of surgery will not be allowed to drive home.  Special Instructions: Bring a copy of your healthcare power of attorney and living will documents         the day of surgery if you haven't scanned them in before.  Please read over the following fact sheets you were given: IF YOU HAVE QUESTIONS ABOUT YOUR PRE OP INSTRUCTIONS PLEASE CALL [phone]   [facility] - Preparing for Surgery Before surgery, you can play an important role.  Because skin is not sterile, your skin needs to be as free of germs as possible.  You can reduce the number of germs on your skin by washing with CHG (chlorahexidine gluconate) soap before surgery.  CHG is an antiseptic cleaner which kills germs and bonds with the skin to continue killing germs even after washing. Please DO NOT use if you have an allergy to CHG or antibacterial soaps.  If your skin becomes reddened/irritated stop using the CHG and inform your nurse when you arrive at Short Stay. Do not shave (including legs and underarms) for at least 48 hours prior to the first CHG shower.  You may shave your face/neck.  Please follow these  instructions carefully:  1.  Shower with CHG Soap the night before surgery and the  morning of surgery.  2.  If you choose to wash your hair, wash your hair first as usual with your normal  shampoo.  3.  After you shampoo, rinse your hair and body thoroughly to remove the shampoo.                             4.  Use CHG as you would any other liquid soap.  You can apply chg directly to the skin and wash.  Gently with a scrungie or clean washcloth.  5.  Apply the CHG Soap to your body ONLY FROM THE NECK DOWN.   Do   not use on face/ open                           Wound or open sores. Avoid contact with eyes, ears mouth and   genitals (private parts).                       Wash face,  Genitals (private parts) with your normal soap.             6.  Wash thoroughly, paying special  attention to the area where your    surgery  will be performed.  7.  Thoroughly rinse your body with warm water from the neck down.  8.  DO NOT shower/wash with your normal soap after using and rinsing off the CHG Soap.                9.  Pat yourself dry with a clean towel.            10.  Wear clean pajamas.            11.  Place clean sheets on your bed the night of your first shower and do not  sleep with pets. Day of Surgery : Do not apply any lotions/deodorants the morning of surgery.  Please wear clean clothes to the hospital/surgery center.  FAILURE TO FOLLOW THESE INSTRUCTIONS MAY RESULT IN THE CANCELLATION OF YOUR SURGERY  PATIENT SIGNATURE_________________________________  NURSE SIGNATURE__________________________________  ________________________________________________________________________

## 2020-09-08 NOTE — Progress Notes (Signed)
COVID swab appointment: N/A  COVID Vaccine Completed: Date COVID Vaccine completed: Has received booster: COVID vaccine manufacturer: Pfizer    [employer]   Date of COVID positive in last 90 days:  PCP - Tally Joe, MD Cardiologist -   Chest x-ray -  EKG -  Stress Test -  ECHO -  Cardiac Cath -  Pacemaker/ICD device last checked: Spinal Cord Stimulator:  Sleep Study -  CPAP -   Fasting Blood Sugar -  Checks Blood Sugar _____ times a day  Blood Thinner Instructions: Aspirin Instructions: Last Dose:  Activity level:  Can go up a flight of stairs and perform activities of daily living without stopping and without symptoms of chest pain or shortness of breath.   Able to exercise without symptoms  Unable to go up a flight of stairs without symptoms of      Anesthesia review: Heart murmur  Patient denies shortness of breath, fever, cough and chest pain at PAT appointment   Patient verbalized understanding of instructions that were given to them at the PAT appointment. Patient was also instructed that they will need to review over the PAT instructions again at home before surgery.

## 2020-09-09 ENCOUNTER — Encounter (HOSPITAL_BASED_OUTPATIENT_CLINIC_OR_DEPARTMENT_OTHER): Payer: Self-pay | Admitting: Orthopedic Surgery

## 2020-09-09 ENCOUNTER — Other Ambulatory Visit: Payer: Self-pay

## 2020-09-10 ENCOUNTER — Encounter (HOSPITAL_BASED_OUTPATIENT_CLINIC_OR_DEPARTMENT_OTHER)
Admission: RE | Admit: 2020-09-10 | Discharge: 2020-09-10 | Disposition: A | Discharge status: Home / Self Care | Payer: No Typology Code available for payment source | Source: Ambulatory Visit | Attending: Orthopedic Surgery | Admitting: Orthopedic Surgery

## 2020-09-10 ENCOUNTER — Encounter (HOSPITAL_COMMUNITY)
Admission: RE | Admit: 2020-09-10 | Discharge: 2020-09-10 | Disposition: A | Discharge status: Home / Self Care | Payer: Self-pay | Source: Ambulatory Visit | Attending: Family Medicine | Admitting: Family Medicine

## 2020-09-10 DIAGNOSIS — M25812 Other specified joint disorders, left shoulder: Secondary | ICD-10-CM | POA: Diagnosis not present

## 2020-09-10 DIAGNOSIS — Z01818 Encounter for other preprocedural examination: Secondary | ICD-10-CM | POA: Diagnosis present

## 2020-09-10 DIAGNOSIS — S43432A Superior glenoid labrum lesion of left shoulder, initial encounter: Secondary | ICD-10-CM | POA: Diagnosis not present

## 2020-09-10 DIAGNOSIS — M75112 Incomplete rotator cuff tear or rupture of left shoulder, not specified as traumatic: Secondary | ICD-10-CM | POA: Diagnosis not present

## 2020-09-10 DIAGNOSIS — Z79899 Other long term (current) drug therapy: Secondary | ICD-10-CM | POA: Diagnosis not present

## 2020-09-10 DIAGNOSIS — M7522 Bicipital tendinitis, left shoulder: Secondary | ICD-10-CM | POA: Diagnosis not present

## 2020-09-10 DIAGNOSIS — M19012 Primary osteoarthritis, left shoulder: Secondary | ICD-10-CM | POA: Diagnosis present

## 2020-09-10 DIAGNOSIS — Y990 Civilian activity done for income or pay: Secondary | ICD-10-CM | POA: Diagnosis not present

## 2020-09-10 LAB — BASIC METABOLIC PANEL
Anion gap: 6 (ref 5–15)
BUN: 7 mg/dL (ref 6–20)
CO2: 28 mmol/L (ref 22–32)
Calcium: 9.1 mg/dL (ref 8.9–10.3)
Chloride: 105 mmol/L (ref 98–111)
Creatinine, Ser: 0.76 mg/dL (ref 0.44–1.00)
GFR, Estimated: >60 mL/min (ref >60)
Glucose, Bld: 95 mg/dL (ref 70–99)
Potassium: 3.7 mmol/L (ref 3.5–5.1)
Sodium: 139 mmol/L (ref 135–145)

## 2020-09-10 NOTE — Progress Notes (Signed)

## 2020-09-15 ENCOUNTER — Ambulatory Visit (HOSPITAL_BASED_OUTPATIENT_CLINIC_OR_DEPARTMENT_OTHER): Payer: No Typology Code available for payment source | Admitting: Anesthesiology

## 2020-09-15 ENCOUNTER — Other Ambulatory Visit: Payer: Self-pay

## 2020-09-15 ENCOUNTER — Ambulatory Visit (HOSPITAL_BASED_OUTPATIENT_CLINIC_OR_DEPARTMENT_OTHER)
Admission: RE | Admit: 2020-09-15 | Discharge: 2020-09-15 | Disposition: A | Discharge status: Home / Self Care | Payer: No Typology Code available for payment source | Attending: Orthopedic Surgery | Admitting: Orthopedic Surgery

## 2020-09-15 ENCOUNTER — Encounter (HOSPITAL_BASED_OUTPATIENT_CLINIC_OR_DEPARTMENT_OTHER)
Admission: RE | Disposition: A | Discharge status: Home / Self Care | Payer: Self-pay | Source: Home / Self Care | Attending: Orthopedic Surgery

## 2020-09-15 ENCOUNTER — Encounter (HOSPITAL_BASED_OUTPATIENT_CLINIC_OR_DEPARTMENT_OTHER): Payer: Self-pay | Admitting: Orthopedic Surgery

## 2020-09-15 DIAGNOSIS — M19012 Primary osteoarthritis, left shoulder: Secondary | ICD-10-CM | POA: Insufficient documentation

## 2020-09-15 DIAGNOSIS — Y990 Civilian activity done for income or pay: Secondary | ICD-10-CM | POA: Insufficient documentation

## 2020-09-15 DIAGNOSIS — M75112 Incomplete rotator cuff tear or rupture of left shoulder, not specified as traumatic: Secondary | ICD-10-CM | POA: Diagnosis not present

## 2020-09-15 DIAGNOSIS — M25812 Other specified joint disorders, left shoulder: Secondary | ICD-10-CM | POA: Insufficient documentation

## 2020-09-15 DIAGNOSIS — S43432A Superior glenoid labrum lesion of left shoulder, initial encounter: Secondary | ICD-10-CM | POA: Insufficient documentation

## 2020-09-15 DIAGNOSIS — Z79899 Other long term (current) drug therapy: Secondary | ICD-10-CM | POA: Insufficient documentation

## 2020-09-15 DIAGNOSIS — M7522 Bicipital tendinitis, left shoulder: Secondary | ICD-10-CM | POA: Insufficient documentation

## 2020-09-15 HISTORY — DX: Complex regional pain syndrome I, unspecified: G90.50

## 2020-09-15 HISTORY — PX: SHOULDER ARTHROSCOPY WITH SUBACROMIAL DECOMPRESSION: SHX5684

## 2020-09-15 HISTORY — DX: Unspecified osteoarthritis, unspecified site: M19.90

## 2020-09-15 HISTORY — DX: Essential (primary) hypertension: I10

## 2020-09-15 SURGERY — SHOULDER ARTHROSCOPY WITH SUBACROMIAL DECOMPRESSION
Anesthesia: General | Site: Shoulder | Laterality: Left

## 2020-09-15 MED ORDER — FENTANYL CITRATE (PF) 100 MCG/2ML IJ SOLN
100.0000 ug | Freq: Once | INTRAMUSCULAR | Status: AC
  Administered 2020-09-15: 100 ug via INTRAVENOUS

## 2020-09-15 MED ORDER — ONDANSETRON HCL 4 MG/2ML IJ SOLN
INTRAMUSCULAR | Status: DC | PRN
  Administered 2020-09-15: 4 mg via INTRAVENOUS

## 2020-09-15 MED ORDER — ONDANSETRON 4 MG PO TBDP: 4.0000 mg | ORAL_TABLET | Freq: Three times a day (TID) | ORAL | 0 refills | Status: AC | PRN

## 2020-09-15 MED ORDER — LIDOCAINE HCL (CARDIAC) PF 100 MG/5ML IV SOSY
PREFILLED_SYRINGE | INTRAVENOUS | Status: DC | PRN
  Administered 2020-09-15: 60 mg via INTRAVENOUS

## 2020-09-15 MED ORDER — MIDAZOLAM HCL 2 MG/2ML IJ SOLN
2.0000 mg | Freq: Once | INTRAMUSCULAR | Status: AC
  Administered 2020-09-15: 2 mg via INTRAVENOUS

## 2020-09-15 MED ORDER — BUPIVACAINE HCL (PF) 0.25 % IJ SOLN
INTRAMUSCULAR | Status: AC
  Filled 2020-09-15: qty 30

## 2020-09-15 MED ORDER — AMISULPRIDE (ANTIEMETIC) 5 MG/2ML IV SOLN: 10.0000 mg | Freq: Once | INTRAVENOUS | Status: DC | PRN

## 2020-09-15 MED ORDER — SODIUM CHLORIDE 0.9 % IR SOLN
Status: DC | PRN
  Administered 2020-09-15: 3000 mL

## 2020-09-15 MED ORDER — BUPIVACAINE LIPOSOME 1.3 % IJ SUSP
INTRAMUSCULAR | Status: DC | PRN
  Administered 2020-09-15: 10 mL via PERINEURAL

## 2020-09-15 MED ORDER — PROPOFOL 10 MG/ML IV BOLUS
INTRAVENOUS | Status: DC | PRN
  Administered 2020-09-15: 200 mg via INTRAVENOUS

## 2020-09-15 MED ORDER — OXYCODONE HCL 5 MG PO TABS
5.0000 mg | ORAL_TABLET | Freq: Once | ORAL | Status: AC | PRN
  Administered 2020-09-15: 5 mg via ORAL

## 2020-09-15 MED ORDER — OXYCODONE HCL 5 MG PO TABS
ORAL_TABLET | ORAL | Status: AC
  Filled 2020-09-15: qty 1

## 2020-09-15 MED ORDER — PHENYLEPHRINE HCL (PRESSORS) 10 MG/ML IV SOLN
INTRAVENOUS | Status: DC | PRN
  Administered 2020-09-15: 80 ug via INTRAVENOUS

## 2020-09-15 MED ORDER — OXYCODONE HCL 5 MG PO TABS: 5.0000 mg | ORAL_TABLET | ORAL | 0 refills | Status: AC | PRN

## 2020-09-15 MED ORDER — LIDOCAINE HCL (PF) 2 % IJ SOLN
INTRAMUSCULAR | Status: AC
  Filled 2020-09-15: qty 5

## 2020-09-15 MED ORDER — BUPIVACAINE HCL (PF) 0.5 % IJ SOLN
INTRAMUSCULAR | Status: DC | PRN
  Administered 2020-09-15: 20 mL via PERINEURAL

## 2020-09-15 MED ORDER — FENTANYL CITRATE (PF) 100 MCG/2ML IJ SOLN
INTRAMUSCULAR | Status: AC
  Filled 2020-09-15: qty 2

## 2020-09-15 MED ORDER — CEFAZOLIN SODIUM-DEXTROSE 2-4 GM/100ML-% IV SOLN
INTRAVENOUS | Status: AC
  Filled 2020-09-15: qty 100

## 2020-09-15 MED ORDER — LACTATED RINGERS IV SOLN: INTRAVENOUS | Status: DC

## 2020-09-15 MED ORDER — OXYCODONE HCL 5 MG/5ML PO SOLN: 5.0000 mg | Freq: Once | ORAL | Status: AC | PRN

## 2020-09-15 MED ORDER — EPINEPHRINE PF 1 MG/ML IJ SOLN
INTRAMUSCULAR | Status: AC
  Filled 2020-09-15: qty 4

## 2020-09-15 MED ORDER — EPHEDRINE SULFATE 50 MG/ML IJ SOLN
INTRAMUSCULAR | Status: DC | PRN
  Administered 2020-09-15: 10 mg via INTRAVENOUS

## 2020-09-15 MED ORDER — PROMETHAZINE HCL 25 MG/ML IJ SOLN: 6.2500 mg | INTRAMUSCULAR | Status: DC | PRN

## 2020-09-15 MED ORDER — MIDAZOLAM HCL 2 MG/2ML IJ SOLN
INTRAMUSCULAR | Status: AC
  Filled 2020-09-15: qty 2

## 2020-09-15 MED ORDER — DEXAMETHASONE SODIUM PHOSPHATE 10 MG/ML IJ SOLN
INTRAMUSCULAR | Status: AC
  Filled 2020-09-15: qty 1

## 2020-09-15 MED ORDER — CEFAZOLIN SODIUM-DEXTROSE 2-4 GM/100ML-% IV SOLN
2.0000 g | INTRAVENOUS | Status: AC
  Administered 2020-09-15: 2 g via INTRAVENOUS

## 2020-09-15 MED ORDER — MEPERIDINE HCL 25 MG/ML IJ SOLN: 6.2500 mg | INTRAMUSCULAR | Status: DC | PRN

## 2020-09-15 MED ORDER — DEXAMETHASONE SODIUM PHOSPHATE 4 MG/ML IJ SOLN
INTRAMUSCULAR | Status: DC | PRN
  Administered 2020-09-15: 10 mg via INTRAVENOUS

## 2020-09-15 MED ORDER — HYDROMORPHONE HCL 1 MG/ML IJ SOLN: 0.2500 mg | INTRAMUSCULAR | Status: DC | PRN

## 2020-09-15 SURGICAL SUPPLY — 79 items
APL PRP STRL LF DISP 70% ISPRP (MISCELLANEOUS) ×1
BLADE EXCALIBUR 4.0X13 (MISCELLANEOUS) IMPLANT
BLADE SURG 15 STRL LF DISP TIS (BLADE) IMPLANT
BLADE SURG 15 STRL SS (BLADE)
BNDG COHESIVE 4X5 TAN ST LF (GAUZE/BANDAGES/DRESSINGS) IMPLANT
BUR SURG 4D 13L RD FLUTE (BUR) ×1 IMPLANT
BURR OVAL 8 FLU 4.0X13 (MISCELLANEOUS) ×1 IMPLANT
BURR SURG 4D 13L RD FLUTE (BUR) ×2
CANNULA 5.75X71 LONG (CANNULA) IMPLANT
CANNULA TWIST IN 8.25X7CM (CANNULA) IMPLANT
CHLORAPREP W/TINT 26 (MISCELLANEOUS) ×2 IMPLANT
CUTTER BONE 4.0MM X 13CM (MISCELLANEOUS) ×1 IMPLANT
DECANTER SPIKE VIAL GLASS SM (MISCELLANEOUS) IMPLANT
DISSECTOR  3.8MM X 13CM (MISCELLANEOUS)
DISSECTOR 3.8MM X 13CM (MISCELLANEOUS) IMPLANT
DISSECTOR 4.0MMX13CM CVD (MISCELLANEOUS) IMPLANT
DRAPE IMP U-DRAPE 54X76 (DRAPES) ×2 IMPLANT
DRAPE INCISE IOBAN 66X45 STRL (DRAPES) IMPLANT
DRAPE STERI 35X30 U-POUCH (DRAPES) ×2 IMPLANT
DRAPE SURG 17X23 STRL (DRAPES) ×2 IMPLANT
DRAPE U-SHAPE 76X120 STRL (DRAPES) ×4 IMPLANT
DRSG PAD ABDOMINAL 8X10 ST (GAUZE/BANDAGES/DRESSINGS) ×2 IMPLANT
DURAPREP 26ML APPLICATOR (WOUND CARE) ×2 IMPLANT
GAUZE 4X4 16PLY ~~LOC~~+RFID DBL (SPONGE) ×1 IMPLANT
GAUZE SPONGE 4X4 12PLY STRL (GAUZE/BANDAGES/DRESSINGS) ×2 IMPLANT
GAUZE XEROFORM 1X8 LF (GAUZE/BANDAGES/DRESSINGS) ×1 IMPLANT
GLOVE SRG 8 PF TXTR STRL LF DI (GLOVE) ×1 IMPLANT
GLOVE SURG ENC MOIS LTX SZ7.5 (GLOVE) ×4 IMPLANT
GLOVE SURG LTX SZ8 (GLOVE) ×2 IMPLANT
GLOVE SURG POLYISO LF SZ6 (GLOVE) ×1 IMPLANT
GLOVE SURG POLYISO LF SZ7 (GLOVE) ×1 IMPLANT
GLOVE SURG UNDER POLY LF SZ6 (GLOVE) ×1 IMPLANT
GLOVE SURG UNDER POLY LF SZ6.5 (GLOVE) ×2 IMPLANT
GLOVE SURG UNDER POLY LF SZ7 (GLOVE) ×1 IMPLANT
GLOVE SURG UNDER POLY LF SZ8 (GLOVE) ×2
GOWN STRL REUS W/ TWL LRG LVL3 (GOWN DISPOSABLE) ×2 IMPLANT
GOWN STRL REUS W/TWL LRG LVL3 (GOWN DISPOSABLE) ×6
GOWN STRL REUS W/TWL XL LVL3 (GOWN DISPOSABLE) ×2 IMPLANT
LASSO 90 CVE QUICKPAS (DISPOSABLE) IMPLANT
LASSO CRESCENT QUICKPASS (SUTURE) IMPLANT
LOOP 2 FIBERLINK CLOSED (SUTURE) IMPLANT
MANIFOLD NEPTUNE II (INSTRUMENTS) ×2 IMPLANT
NDL SAFETY ECLIPSE 18X1.5 (NEEDLE) ×1 IMPLANT
NDL SCORPION MULTI FIRE (NEEDLE) IMPLANT
NEEDLE HYPO 18GX1.5 SHARP (NEEDLE) ×2
NEEDLE SCORPION MULTI FIRE (NEEDLE) IMPLANT
PACK ARTHROSCOPY DSU (CUSTOM PROCEDURE TRAY) ×2 IMPLANT
PACK BASIN DAY SURGERY FS (CUSTOM PROCEDURE TRAY) ×2 IMPLANT
PAD COLD SHLDR WRAP-ON (PAD) ×1 IMPLANT
PAD ORTHO SHOULDER 7X19 LRG (SOFTGOODS) IMPLANT
PORT APPOLLO RF 90DEGREE MULTI (SURGICAL WAND) IMPLANT
PROBE APOLLO 90XL (SURGICAL WAND) IMPLANT
SHEET MEDIUM DRAPE 40X70 STRL (DRAPES) IMPLANT
SLEEVE ARM SUSPENSION SYSTEM (MISCELLANEOUS) IMPLANT
SLEEVE SCD COMPRESS KNEE MED (STOCKING) ×2 IMPLANT
SLING ARM FOAM STRAP LRG (SOFTGOODS) ×1 IMPLANT
SLING S3 LATERAL DISP (MISCELLANEOUS) IMPLANT
SPONGE T-LAP 4X18 ~~LOC~~+RFID (SPONGE) IMPLANT
SUCTION FRAZIER HANDLE 10FR (MISCELLANEOUS)
SUCTION TUBE FRAZIER 10FR DISP (MISCELLANEOUS) IMPLANT
SUT 2 FIBERLOOP 20 STRT BLUE (SUTURE)
SUT ETHILON 3 0 PS 1 (SUTURE) IMPLANT
SUT FIBERWIRE #2 38 T-5 BLUE (SUTURE)
SUT MNCRL AB 3-0 PS2 27 (SUTURE) IMPLANT
SUT PDS AB 0 CT 36 (SUTURE) IMPLANT
SUT TIGER TAPE 7 IN WHITE (SUTURE) IMPLANT
SUT VIC AB 0 CT1 36 (SUTURE) IMPLANT
SUT VIC AB 2-0 CT1 27 (SUTURE)
SUT VIC AB 2-0 CT1 TAPERPNT 27 (SUTURE) IMPLANT
SUTURE 2 FIBERLOOP 20 STRT BLU (SUTURE) IMPLANT
SUTURE FIBERWR #2 38 T-5 BLUE (SUTURE) IMPLANT
SUTURE TAPE TIGERLINK 1.3MM BL (SUTURE) IMPLANT
SUTURETAPE TIGERLINK 1.3MM BL (SUTURE)
SYR 5ML LL (SYRINGE) ×2 IMPLANT
TAPE FIBER 2MM 7IN #2 BLUE (SUTURE) IMPLANT
TOWEL GREEN STERILE FF (TOWEL DISPOSABLE) ×2 IMPLANT
TUBE CONNECTING 20X1/4 (TUBING) ×1 IMPLANT
TUBING ARTHROSCOPY IRRIG 16FT (MISCELLANEOUS) ×2 IMPLANT
YANKAUER SUCT BULB TIP NO VENT (SUCTIONS) IMPLANT

## 2020-09-15 NOTE — Op Note (Signed)
09/15/2020   PATIENT:  Wendy Everett    PRE-OPERATIVE DIAGNOSIS:   1.  Left shoulder acromioclavicular joint arthritis 2.  Left shoulder proximal biceps tendinitis 3.  Left shoulder subacromial impingement 4.  Left shoulder degenerative labral tearing anterior, superior, posterior 5.  Left shoulder partial articular sided tearing of the supraspinatus less than 10%   POST-OPERATIVE DIAGNOSIS:  Same  PROCEDURE:   1.  Left shoulder arthroscopic extensive debridement of rotator interval, glenoid fossa with anterior, superior, posterior labrum, and supraspinatus tendon. 2.  Left shoulder arthroscopic subacromial decompression 3.  Left shoulder arthroscopic distal clavicle resection (Mumford procedure).  SURGEON:  Yolonda Kida, MD  PHYSICIAN ASSISTANT: Dion Saucier, PA-C  Assistant Attestation:  PA McClung, present and scrubbed for the entire procedure.  ANESTHESIA:   General with regional block.  ESTIMATED BLOOD LOSS: 10 cc  PREOPERATIVE INDICATIONS:  Wendy Everett is a  55 y.o. female with a diagnosis of Left shoulder acromioclavicular osteoarthritis and impingement who failed conservative measures and elected for surgical management.    The risks benefits and alternatives were discussed with the patient preoperatively including but not limited to the risks of infection, bleeding, nerve injury, cardiopulmonary complications, the need for revision surgery, among others, and the patient was willing to proceed.  OPERATIVE IMPLANTS:  None  OPERATIVE FINDINGS:  On the intra-articular portion of the arthroscopy there was no glenohumeral arthritis.  She did have some inflammation and synovitis noted in the rotator interval with a undersurface tear on the articular portion of the supraspinatus that was less than 10%.  The teres minor, infraspinatus pop, and subscapularis were all intact.  Labral tearing was noted anterior, superior, and posterior degenerative fashion.  The long  head of the biceps was without inflammation but was noted to be attached to the torn portion of the labrum.  The subacromial space there was a type II acromial morphology.  There was some scuffing of the bursal side of the supraspinatus and infraspinatus but no tendon tear noted, and there was significant undersurface spurring of the distal clavicle with sclerotic edges and edematous bone noted.   OPERATIVE PROCEDURE: The patient was brought to the operating room and placed in the supine position. General anesthesia was administered. IV antibiotics were given. General anesthesia was administered.   The upper extremity was examined and found to be grossly unstable particularly to anterior testing. The upper extremity was prepped and draped in the usual sterile fashion. The patient was in a semilateral decubitus position.  Time out was performed. Diagnostic arthroscopy was carried out the above-named findings.   We began the procedure by establishing a posterior lateral viewing portal.  This was established 2 cm distant and 1 cm medial to the posterior lateral corner of the acromion.  We then established a mid glenoid working portal via direct spinal needle localization.  Once we entered the joint diagnostic arthroscopy was carried out with the above findings.  Next, we performed the extensive debridement.  Utilizing motorized shaver the rotator interval was completely debrided from the upper border of the subscapularis tendon to the leading edge of the supraspinatus tendon.  We then tenotomized the biceps tendon with the radiofrequency ablation device.  Next, we debrided the anterior, superior, posterior labrum with motorized shaver.  We then debrided gently the undersurface tearing of the supraspinatus on the articular side.  Next, we entered the subacromial space.  Once in the subacromial space we established a lateral working portal via spinal needle localization.  This  was established 4 cm lateral to the  lateral edge of the acromion in line with the acromioclavicular joint.  We then performed a wide bursectomy with motorized shaver.  We were then able to perform our subacromial decompression.  While viewing from the lateral portal and working from the posterior portal used a cutting block technique and a motorized bur to contour the type II acromion to a flat type I morphology.  Lastly, we worked her way over to the acromioclavicular joint.  While viewing from lateral portal and working from anterior portal we identified the undersurface spur of the distal clavicle.  This was quite sclerotic.  We then introduced a motorized bur from the anterior working portal.  We then resected approximately 1 cm of the distal clavicle.  Care was taken to preserve posterior capsular structures well superior capsular structures to maintain anterior and posterior stability.  Pictures were taken throughout the procedure.   the arthroscopic cannulas were removed, and the portals closed with Monocryl followed by Steri-Strips and sterile gauze. The patient was awakened and returned to the PACU in stable and satisfactory condition. There were no complications and the patient tolerated the procedure well.  All counts were correct.  Disposition:   Operative extremity was placed in a simple sling.  This will be worn until the regional anesthetic resolves.  Once the nerve block has worn off she may begin using the arm as able and as tolerated.  She should begin physical therapy in 1 week.  I will see her back in the office in 2 weeks.

## 2020-09-15 NOTE — Progress Notes (Signed)
Assisted Dr. Miller with left, ultrasound guided, interscalene  block. Side rails up, monitors on throughout procedure. See vital signs in flow sheet. Tolerated Procedure well.  

## 2020-09-15 NOTE — Brief Op Note (Signed)
09/15/2020  1:21 PM  PATIENT:  Wendy Everett  55 y.o. female  PRE-OPERATIVE DIAGNOSIS:  Left shoulder acromioclavicular osteoarthritis and impingement  POST-OPERATIVE DIAGNOSIS:  1.  Left shoulder acromioclavicular arthritis 2.  Left shoulder proximal biceps tendinopathy 3.  Left shoulder impingement 4.  Left shoulder labrum tearing anterior, posterior, superior  PROCEDURE:  Procedure(s) with comments: SHOULDER ARTHROSCOPY WITH SUBACROMIAL DECOMPRESSION, DISTAL CLAVICLE RESECTION AND EXTENSIVE DEBRIDEMENT (Left) -  SURGEON:  Surgeon(s) and Role:    * Aundria Rud, Noah Delaine, MD - Primary  PHYSICIAN ASSISTANT: Dion Saucier, PA-C  ANESTHESIA:   regional and general  EBL:  10 cc  BLOOD ADMINISTERED:none  DRAINS: none   LOCAL MEDICATIONS USED:  NONE  SPECIMEN:  No Specimen  DISPOSITION OF SPECIMEN:  N/A  COUNTS:  YES  TOURNIQUET:  * No tourniquets in log *  DICTATION: .Note written in EPIC  PLAN OF CARE: Discharge to home after PACU  PATIENT DISPOSITION:  PACU - hemodynamically stable.   Delay start of Pharmacological VTE agent (>24hrs) due to surgical blood loss or risk of bleeding: not applicable

## 2020-09-15 NOTE — Anesthesia Preprocedure Evaluation (Signed)
Anesthesia Evaluation  Patient identified by MRN, date of birth, ID band Patient awake    Reviewed: Allergy & Precautions, NPO status , Patient's Chart, lab work & pertinent test results  Airway Mallampati: II  TM Distance: >3 FB Neck ROM: Full    Dental no notable dental hx.    Pulmonary neg pulmonary ROS,    Pulmonary exam normal breath sounds clear to auscultation       Cardiovascular hypertension, Pt. on medications negative cardio ROS Normal cardiovascular exam Rhythm:Regular Rate:Normal     Neuro/Psych negative neurological ROS  negative psych ROS   GI/Hepatic Neg liver ROS, GERD  ,  Endo/Other  negative endocrine ROS  Renal/GU negative Renal ROS  negative genitourinary   Musculoskeletal  (+) Arthritis , Osteoarthritis,    Abdominal (+) + obese,   Peds negative pediatric ROS (+)  Hematology negative hematology ROS (+)   Anesthesia Other Findings   Reproductive/Obstetrics negative OB ROS                             Anesthesia Physical Anesthesia Plan  ASA: 2  Anesthesia Plan: General   Post-op Pain Management:  Regional for Post-op pain   Induction: Intravenous  PONV Risk Score and Plan: 3 and Ondansetron, Dexamethasone, Midazolam and Treatment may vary due to age or medical condition  Airway Management Planned: LMA  Additional Equipment:   Intra-op Plan:   Post-operative Plan: Extubation in OR  Informed Consent: I have reviewed the patients History and Physical, chart, labs and discussed the procedure including the risks, benefits and alternatives for the proposed anesthesia with the patient or authorized representative who has indicated his/her understanding and acceptance.     Dental advisory given  Plan Discussed with: CRNA  Anesthesia Plan Comments:         Anesthesia Quick Evaluation

## 2020-09-15 NOTE — Anesthesia Postprocedure Evaluation (Signed)
Anesthesia Post Note  Patient: Wendy Everett  Procedure(s) Performed: SHOULDER ARTHROSCOPY WITH SUBACROMIAL DECOMPRESSION, DISTAL CLAVICLE RESECTION AND EXTENSIVE DEBRIDEMENT (Left: Shoulder)     Patient location during evaluation: PACU Anesthesia Type: General Level of consciousness: awake and alert Pain management: pain level controlled Vital Signs Assessment: post-procedure vital signs reviewed and stable Respiratory status: spontaneous breathing, nonlabored ventilation and respiratory function stable Cardiovascular status: blood pressure returned to baseline and stable Postop Assessment: no apparent nausea or vomiting Anesthetic complications: no   No notable events documented.  Last Vitals:  Vitals:   09/15/20 1415 09/15/20 1430  BP: 114/64 (!) 107/55  Pulse: 82 84  Resp: 19 19  Temp:    SpO2: 96% 96%    Last Pain:  Vitals:   09/15/20 1500  TempSrc:   PainSc: 5                  Lowella Curb

## 2020-09-15 NOTE — Discharge Instructions (Addendum)
Postoperative shoulder instructions:  -Maintain postoperative bandages for 3 days.  You may remove these on the third day and begin showering.  Please do not submerge your incisions underwater.  -Maintain your sling until your nerve block resolves.  Once your arm is no longer feeling numb you can begin coming out of the sling and using the arm as tolerated.  The sling can be worn just for comfort only.  -Apply ice to the left shoulder for 20 to 30 minutes out of each hour that you are awake and able.  Do this around-the-clock as often as possible.  For mild to moderate pain use Tylenol and Advil in alternating fashion.  He can use these around-the-clock.  For any breakthrough pain use oxycodone as necessary.  -Return to see Dr. Aundria Rud in 2 weeks for routine postoperative check.   Post Anesthesia Home Care Instructions  Activity: Get plenty of rest for the remainder of the day. A responsible individual must stay with you for 24 hours following the procedure.  For the next 24 hours, DO NOT: -Drive a car -Advertising copywriter -Drink alcoholic beverages -Take any medication unless instructed by your physician -Make any legal decisions or sign important papers.  Meals: Start with liquid foods such as gelatin or soup. Progress to regular foods as tolerated. Avoid greasy, spicy, heavy foods. If nausea and/or vomiting occur, drink only clear liquids until the nausea and/or vomiting subsides. Call your physician if vomiting continues.  Special Instructions/Symptoms: Your throat may feel dry or sore from the anesthesia or the breathing tube placed in your throat during surgery. If this causes discomfort, gargle with warm salt water. The discomfort should disappear within 24 hours.  If you had a scopolamine patch placed behind your ear for the management of post- operative nausea and/or vomiting:  1. The medication in the patch is effective for 72 hours, after which it should be removed.  Wrap patch  in a tissue and discard in the trash. Wash hands thoroughly with soap and water. 2. You may remove the patch earlier than 72 hours if you experience unpleasant side effects which may include dry mouth, dizziness or visual disturbances. 3. Avoid touching the patch. Wash your hands with soap and water after contact with the patch.       Regional Anesthesia Blocks  1. Numbness or the inability to move the "blocked" extremity may last from 3-48 hours after placement. The length of time depends on the medication injected and your individual response to the medication. If the numbness is not going away after 48 hours, call your surgeon.  2. The extremity that is blocked will need to be protected until the numbness is gone and the  Strength has returned. Because you cannot feel it, you will need to take extra care to avoid injury. Because it may be weak, you may have difficulty moving it or using it. You may not know what position it is in without looking at it while the block is in effect.  3. For blocks in the legs and feet, returning to weight bearing and walking needs to be done carefully. You will need to wait until the numbness is entirely gone and the strength has returned. You should be able to move your leg and foot normally before you try and bear weight or walk. You will need someone to be with you when you first try to ensure you do not fall and possibly risk injury.  4. Bruising and tenderness at the needle  site are common side effects and will resolve in a few days.  5. Persistent numbness or new problems with movement should be communicated to the surgeon or the [facility] [phone] [facility] [phone]).    Information for Discharge Teaching: EXPAREL (bupivacaine liposome injectable suspension)   Your surgeon or anesthesiologist gave you EXPAREL(bupivacaine) to help control your pain after surgery.  EXPAREL is a local anesthetic that provides pain  relief by numbing the tissue around the surgical site. EXPAREL is designed to release pain medication over time and can control pain for up to 72 hours. Depending on how you respond to EXPAREL, you may require less pain medication during your recovery.  Possible side effects: Temporary loss of sensation or ability to move in the area where bupivacaine was injected. Nausea, vomiting, constipation Rarely, numbness and tingling in your mouth or lips, lightheadedness, or anxiety may occur. Call your doctor right away if you think you may be experiencing any of these sensations, or if you have other questions regarding possible side effects.  Follow all other discharge instructions given to you by your surgeon or nurse. Eat a healthy diet and drink plenty of water or other fluids.  If you return to the hospital for any reason within 96 hours following the administration of EXPAREL, it is important for health care providers to know that you have received this anesthetic. A teal colored band has been placed on your arm with the date, time and amount of EXPAREL you have received in order to alert and inform your health care providers. Please leave this armband in place for the full 96 hours following administration, and then you may remove the band.

## 2020-09-15 NOTE — Transfer of Care (Signed)
Immediate Anesthesia Transfer of Care Note  Patient: Wendy Everett  Procedure(s) Performed: SHOULDER ARTHROSCOPY WITH SUBACROMIAL DECOMPRESSION, DISTAL CLAVICLE RESECTION AND EXTENSIVE DEBRIDEMENT (Left: Shoulder)  Patient Location: PACU  Anesthesia Type:GA combined with regional for post-op pain  Level of Consciousness: awake, alert  and oriented  Airway & Oxygen Therapy: Patient Spontanous Breathing and Patient connected to face mask oxygen  Post-op Assessment: Report given to RN and Post -op Vital signs reviewed and stable  Post vital signs: Reviewed and stable  Last Vitals:  Vitals Value Taken Time  BP 110/47 09/15/20 1345  Temp 36.1 C 09/15/20 1345  Pulse 92 09/15/20 1346  Resp 23 09/15/20 1346  SpO2 99 % 09/15/20 1346  Vitals shown include unvalidated device data.  Last Pain:  Vitals:   09/15/20 1100  TempSrc: Oral  PainSc: 6       Patients Stated Pain Goal: 2 (09/15/20 1100)  Complications: No notable events documented.

## 2020-09-15 NOTE — H&P (Signed)
ORTHOPAEDIC H and P  REQUESTING PHYSICIAN: Yolonda Kida, MD  PCP:  Tally Joe, MD  Chief Complaint: Left shoulder pain  HPI: Wendy Everett is a 55 y.o. female who complains of recalcitrant left shoulder pain following a course of conservative care over the last few months.  This related to an injury sustained at work.  She is here today for arthroscopic management of her symptomatic distal clavicle as well as impingement and proximal biceps tendinopathy.  No new complaints at this time.  We previously discussed the recommendation for surgical intervention.  Past Medical History:  Diagnosis Date   Arthritis    Chronic cholecystitis without calculus 01/03/2011   Cyst    on kidney   GERD (gastroesophageal reflux disease)    Heart murmur    Hypertension    Irritable bowel syndrome with constipation 03/02/2011   Type I CRPS (complex regional pain syndrome)    left arm   Past Surgical History:  Procedure Laterality Date   ABDOMINAL HYSTERECTOMY     partial   CESAREAN SECTION     FOOT SURGERY     left   LAPAROSCOPIC CHOLECYSTECTOMY  02/11/11   single site   TONSILLECTOMY     Social History   Socioeconomic History   Marital status: Married    Spouse name: Not on file   Number of children: Not on file   Years of education: Not on file   Highest education level: Not on file  Occupational History   Not on file  Tobacco Use   Smoking status: Never   Smokeless tobacco: Never  Substance and Sexual Activity   Alcohol use: No    Alcohol/week: 0.0 standard drinks   Drug use: No   Sexual activity: Not on file  Other Topics Concern   Not on file  Social History Narrative   Not on file   Social Determinants of Health   Financial Resource Strain: Not on file  Food Insecurity: Not on file  Transportation Needs: Not on file  Physical Activity: Not on file  Stress: Not on file  Social Connections: Not on file   Family History  Problem Relation Age of Onset    Heart disease Mother    No Known Allergies Prior to Admission medications   Medication Sig Start Date End Date Taking? Authorizing Provider  diphenhydrAMINE (BENADRYL) 25 MG tablet Take 50 mg by mouth every 6 (six) hours as needed for itching or sleep.   Yes [provider]  diphenhydramine-acetaminophen (TYLENOL PM) 25-500 MG TABS tablet Take 1 tablet by mouth at bedtime as needed.   Yes [provider]  esomeprazole (NEXIUM) 40 MG capsule Take 40 mg by mouth daily as needed (Heartburn).   Yes [provider]  gabapentin (NEURONTIN) 100 MG capsule Take 100 mg by mouth daily as needed (pain). 07/30/20  Yes [provider]  HYDROcodone-acetaminophen (NORCO/VICODIN) 5-325 MG tablet Take 2 tablets by mouth every 4 (four) hours as needed. Patient taking differently: Take 1 tablet by mouth every 4 (four) hours as needed for severe pain or moderate pain. 11/19/18  Yes Mickie Bail, NP  Multiple Vitamins-Minerals (MULTIVITAMIN WITH MINERALS) tablet Take 1 tablet by mouth daily.   Yes [provider]  olmesartan-hydrochlorothiazide (BENICAR HCT) 20-12.5 MG tablet Take 1 tablet by mouth daily. 08/18/20  Yes [provider]  Omega-3 1000 MG CAPS Take by mouth.   Yes [provider]  rosuvastatin (CRESTOR) 10 MG tablet Take 10 mg by  mouth daily. 08/06/20  Yes [provider]   No results found.  Positive ROS: All other systems have been reviewed and were otherwise negative with the exception of those mentioned in the HPI and as above.  Physical Exam: General: Alert, no acute distress Cardiovascular: No pedal edema Respiratory: No cyanosis, no use of accessory musculature GI: No organomegaly, abdomen is soft and non-tender Skin: No lesions in the area of chief complaint Neurologic: Sensation intact distally Psychiatric: Patient is competent for consent with normal mood and affect Lymphatic: No axillary or cervical  lymphadenopathy  MUSCULOSKELETAL:  Left upper extremity is warm and well-perfused.  No open wounds or skin lesions.  Neurovascular intact.  Assessment: 1.  Left shoulder acromioclavicular arthritis 2.  Left shoulder proximal biceps tendinopathy 3.  Left shoulder impingement  Plan: -Plan to proceed today with arthroscopic management of the left shoulder.  We discussed arthroscopic distal clavicle resection with subacromial decompression and extensive debridement.  We reviewed the risk of bleeding, infection, damage to surrounding nerves and vessels, stiffness, failure of repairs, need for further surgery, and the risk of anesthesia.  She has provided informed consent.  -Plan for discharge postoperatively from PACU.  I will see her back in the office in 2 weeks.    Yolonda Kida, MD Cell [phone]    09/15/2020 12:19 PM

## 2020-09-15 NOTE — Anesthesia Procedure Notes (Signed)
Procedure Name: LMA Insertion Date/Time: 09/15/2020 12:42 PM Performed by: Burna Cash, CRNA Pre-anesthesia Checklist: Patient identified, Emergency Drugs available, Suction available and Patient being monitored Patient Re-evaluated:Patient Re-evaluated prior to induction Oxygen Delivery Method: Circle system utilized Preoxygenation: Pre-oxygenation with 100% oxygen Induction Type: IV induction Ventilation: Mask ventilation without difficulty LMA: LMA inserted LMA Size: 4.0 Number of attempts: 1 Airway Equipment and Method: Bite block Placement Confirmation: positive ETCO2 Tube secured with: Tape Dental Injury: Teeth and Oropharynx as per pre-operative assessment

## 2020-09-15 NOTE — Anesthesia Procedure Notes (Signed)
Anesthesia Regional Block: Interscalene brachial plexus block   Pre-Anesthetic Checklist: , timeout performed,  Correct Patient, Correct Site, Correct Laterality,  Correct Procedure, Correct Position, site marked,  Risks and benefits discussed,  Surgical consent,  Pre-op evaluation,  At surgeon's request and post-op pain management  Laterality: Left  Prep: chloraprep       Needles:  Injection technique: Single-shot  Needle Type: Stimiplex     Needle Length: 9cm  Needle Gauge: 21     Additional Needles:   Procedures:,,,, ultrasound used (permanent image in chart),,    Narrative:  Start time: 09/15/2020 12:13 PM End time: 09/15/2020 12:18 PM Injection made incrementally with aspirations every 5 mL.  Performed by: Personally  Anesthesiologist: Lowella Curb, MD

## 2020-09-18 ENCOUNTER — Encounter (HOSPITAL_BASED_OUTPATIENT_CLINIC_OR_DEPARTMENT_OTHER): Payer: Self-pay | Admitting: Orthopedic Surgery

## 2020-12-26 IMAGING — CR DG WRIST COMPLETE 3+V*L*
4 series · 4 of 4 positions shown · non-contrast
Comparison: None.

CLINICAL DATA: Left wrist injury 3 hours ago.

EXAM:
LEFT WRIST - COMPLETE 3+ VIEW

[x wrist pa left]
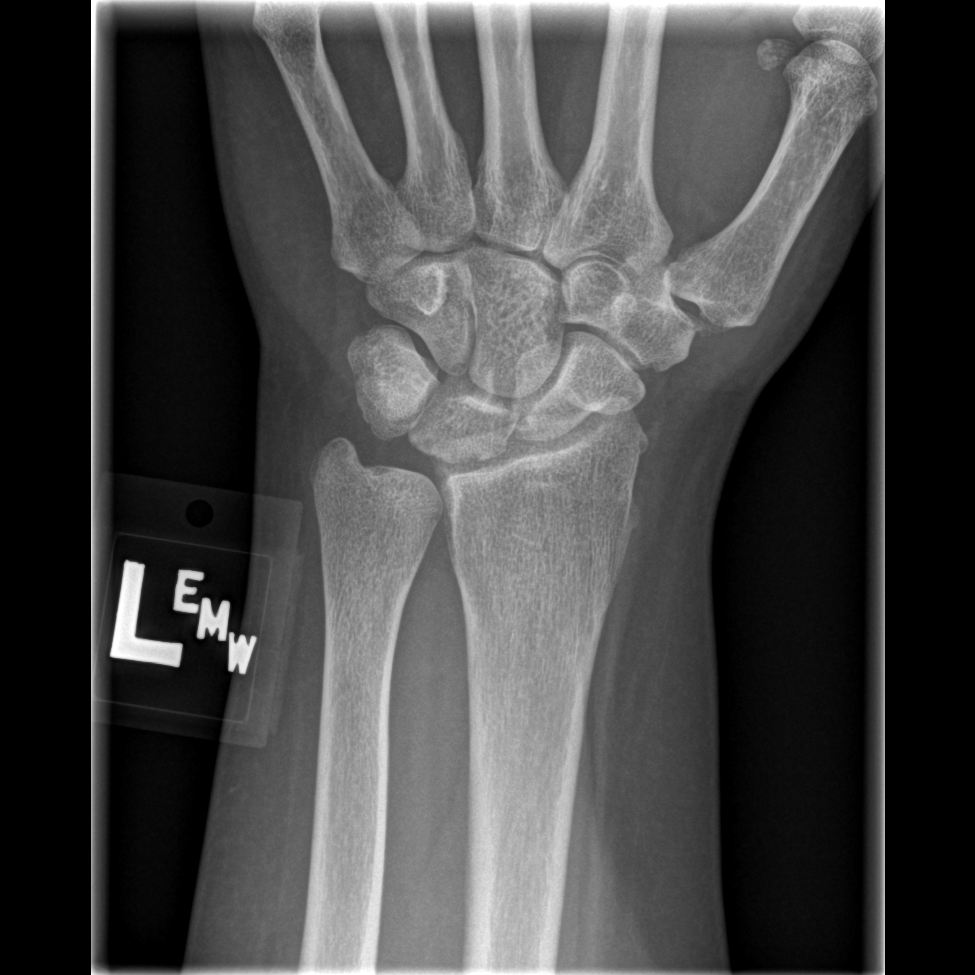

[x wrist obl left]
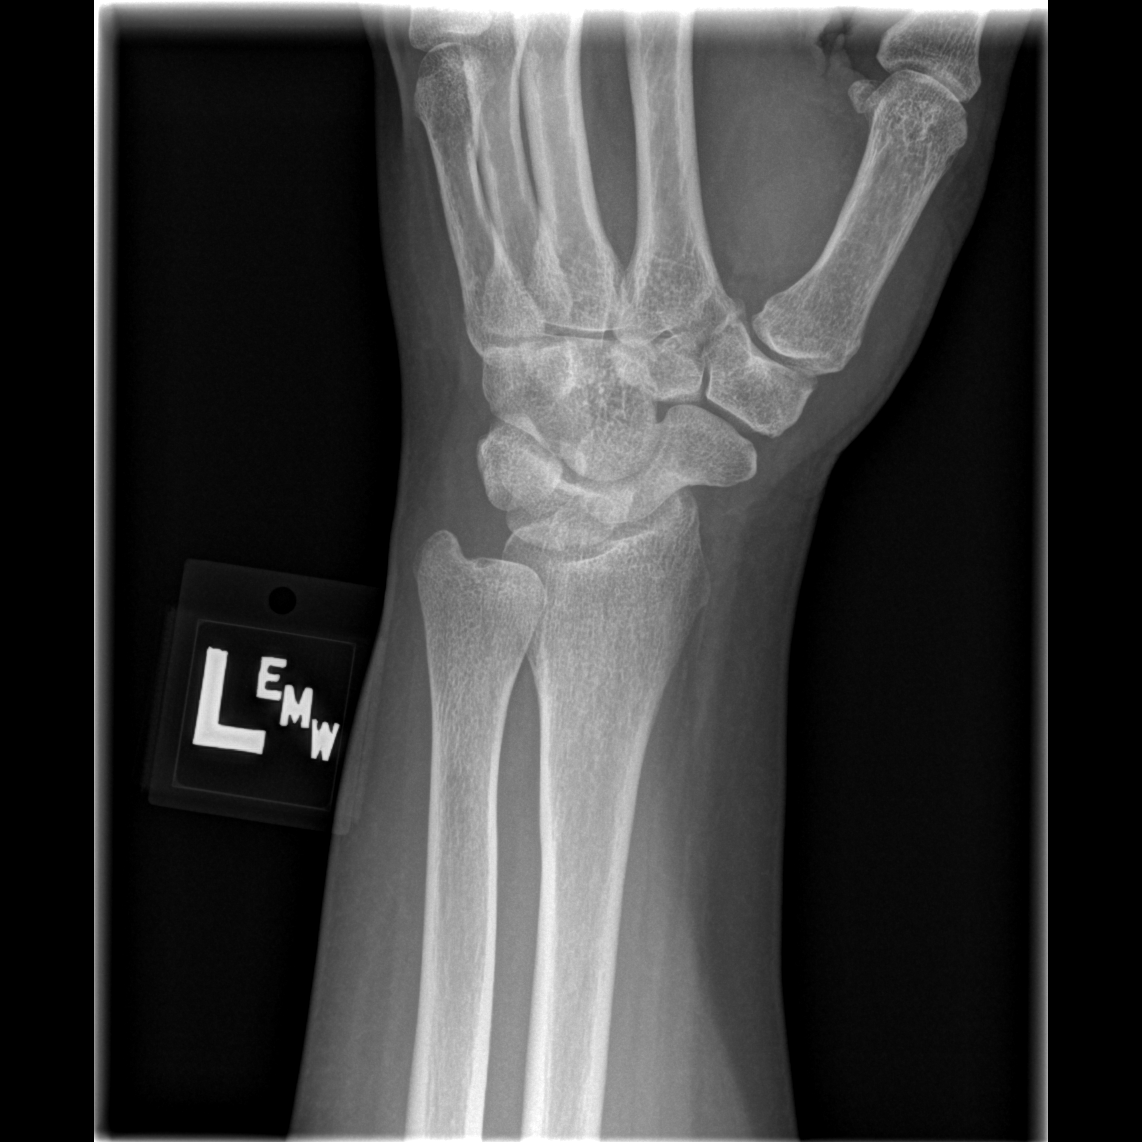

[x wrist lat left]
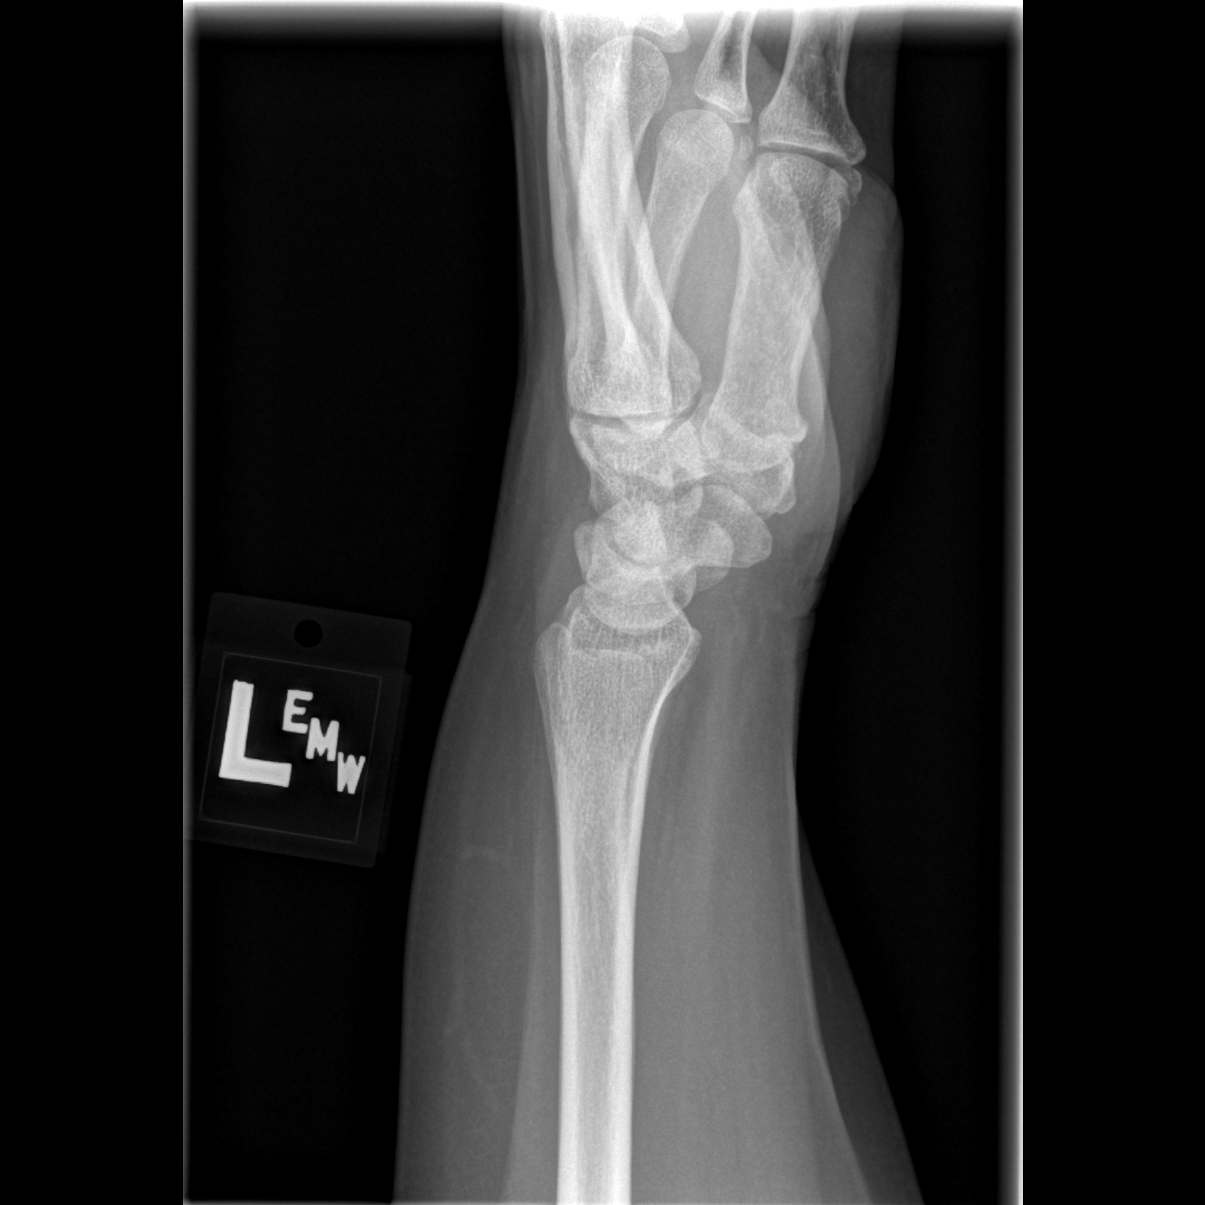

[x navicular]
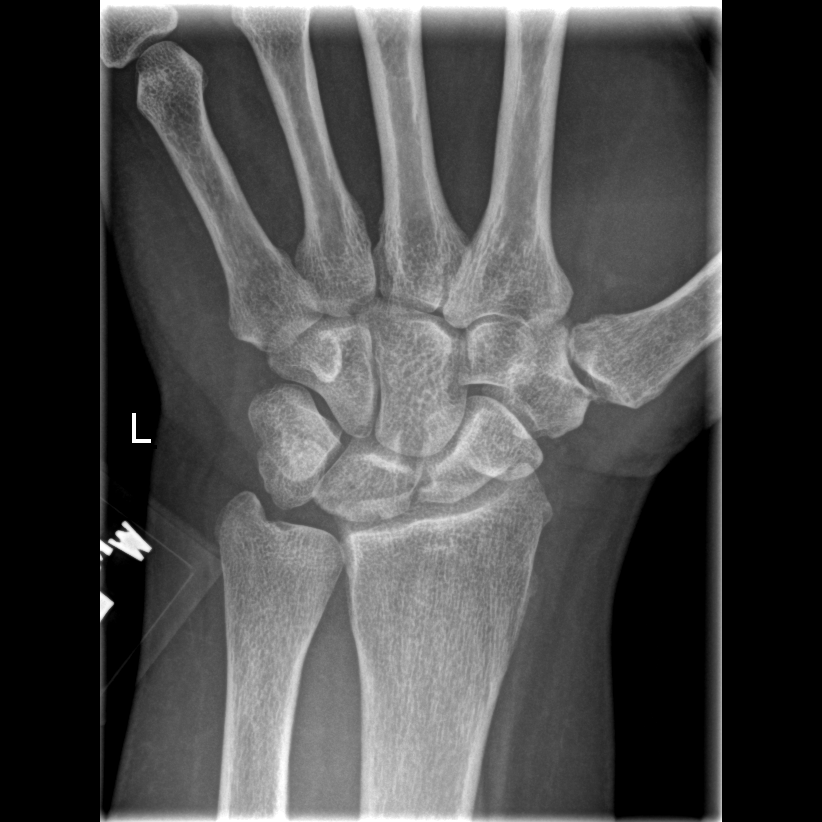

[4 of 4 positions shown; findings below may reference images not displayed]

FINDINGS: Degenerate changes involving the radiocarpal articulation. No acute
fracture or dislocation. Scaphoid intact.
IMPRESSION: No acute osseous abnormality.

## 2022-05-05 ENCOUNTER — Encounter: Payer: Self-pay | Admitting: *Deleted

## 2022-12-13 ENCOUNTER — Other Ambulatory Visit: Payer: Self-pay | Admitting: Family Medicine

## 2022-12-13 DIAGNOSIS — Z1231 Encounter for screening mammogram for malignant neoplasm of breast: Secondary | ICD-10-CM

## 2022-12-14 ENCOUNTER — Ambulatory Visit
Admission: RE | Admit: 2022-12-14 | Discharge: 2022-12-14 | Disposition: A | Discharge status: Home / Self Care | Payer: Medicaid Other | Source: Ambulatory Visit | Attending: Family Medicine | Admitting: Family Medicine

## 2022-12-14 DIAGNOSIS — Z1231 Encounter for screening mammogram for malignant neoplasm of breast: Secondary | ICD-10-CM

## 2022-12-20 ENCOUNTER — Other Ambulatory Visit: Payer: Self-pay | Admitting: Family Medicine

## 2022-12-20 DIAGNOSIS — R928 Other abnormal and inconclusive findings on diagnostic imaging of breast: Secondary | ICD-10-CM

## 2022-12-28 ENCOUNTER — Ambulatory Visit
Admission: RE | Admit: 2022-12-28 | Discharge: 2022-12-28 | Disposition: A | Discharge status: Home / Self Care | Payer: Medicaid Other | Source: Ambulatory Visit | Attending: Family Medicine | Admitting: Family Medicine

## 2022-12-28 ENCOUNTER — Other Ambulatory Visit: Payer: Self-pay | Admitting: Family Medicine

## 2022-12-28 ENCOUNTER — Encounter: Payer: Self-pay | Admitting: Family Medicine

## 2022-12-28 DIAGNOSIS — R599 Enlarged lymph nodes, unspecified: Secondary | ICD-10-CM

## 2022-12-28 DIAGNOSIS — R928 Other abnormal and inconclusive findings on diagnostic imaging of breast: Secondary | ICD-10-CM

## 2023-01-03 ENCOUNTER — Other Ambulatory Visit: Payer: Medicaid Other

## 2023-03-29 ENCOUNTER — Other Ambulatory Visit

## 2023-03-29 ENCOUNTER — Other Ambulatory Visit: Payer: Medicaid Other

## 2023-04-04 ENCOUNTER — Other Ambulatory Visit: Payer: Self-pay | Admitting: Family Medicine

## 2023-04-04 ENCOUNTER — Ambulatory Visit
Admission: RE | Admit: 2023-04-04 | Discharge: 2023-04-04 | Disposition: A | Discharge status: Home / Self Care | Source: Ambulatory Visit | Attending: Family Medicine | Admitting: Family Medicine

## 2023-04-04 DIAGNOSIS — R599 Enlarged lymph nodes, unspecified: Secondary | ICD-10-CM

## 2023-12-12 ENCOUNTER — Encounter

## 2023-12-12 DIAGNOSIS — Z1231 Encounter for screening mammogram for malignant neoplasm of breast: Secondary | ICD-10-CM

## 2024-01-05 ENCOUNTER — Inpatient Hospital Stay: Admission: RE | Admit: 2024-01-05 | Discharge: 2024-01-05 | Attending: Family Medicine | Admitting: Family Medicine

## 2024-01-05 ENCOUNTER — Inpatient Hospital Stay: Admission: RE | Admit: 2024-01-05

## 2024-01-05 ENCOUNTER — Other Ambulatory Visit: Payer: Self-pay | Admitting: Family Medicine

## 2024-01-05 ENCOUNTER — Other Ambulatory Visit

## 2024-01-05 ENCOUNTER — Inpatient Hospital Stay
Admission: RE | Admit: 2024-01-05 | Discharge: 2024-01-05 | Payer: Medicare (Managed Care) | Attending: Family Medicine | Admitting: Family Medicine

## 2024-01-05 DIAGNOSIS — R599 Enlarged lymph nodes, unspecified: Secondary | ICD-10-CM
